# Patient Record
Sex: Male | Born: 1955
Health system: Southern US, Community
[De-identification: ages and names within clinical notes are randomized; demographics above are authoritative.]

## PROBLEM LIST (undated history)

## (undated) DIAGNOSIS — K649 Unspecified hemorrhoids: Secondary | ICD-10-CM

## (undated) DIAGNOSIS — R319 Hematuria, unspecified: Secondary | ICD-10-CM

## (undated) DIAGNOSIS — H539 Unspecified visual disturbance: Secondary | ICD-10-CM

## (undated) DIAGNOSIS — S0121XA Laceration without foreign body of nose, initial encounter: Secondary | ICD-10-CM

## (undated) DIAGNOSIS — N289 Disorder of kidney and ureter, unspecified: Secondary | ICD-10-CM

## (undated) DIAGNOSIS — R55 Syncope and collapse: Secondary | ICD-10-CM

## (undated) DIAGNOSIS — Z905 Acquired absence of kidney: Secondary | ICD-10-CM

## (undated) DIAGNOSIS — K648 Other hemorrhoids: Principal | ICD-10-CM

## (undated) DIAGNOSIS — T7840XA Allergy, unspecified, initial encounter: Secondary | ICD-10-CM

## (undated) DIAGNOSIS — S83249A Other tear of medial meniscus, current injury, unspecified knee, initial encounter: Secondary | ICD-10-CM

## (undated) DIAGNOSIS — D649 Anemia, unspecified: Secondary | ICD-10-CM

## (undated) DIAGNOSIS — R0981 Nasal congestion: Secondary | ICD-10-CM

## (undated) DIAGNOSIS — IMO0002 Reserved for concepts with insufficient information to code with codable children: Secondary | ICD-10-CM

## (undated) HISTORY — DX: Hematuria, unspecified: R31.9

## (undated) HISTORY — DX: Laceration without foreign body of nose, initial encounter: S01.21XA

## (undated) HISTORY — DX: Unspecified hemorrhoids: K64.9

## (undated) HISTORY — DX: Reserved for concepts with insufficient information to code with codable children: IMO0002

## (undated) HISTORY — DX: Unspecified visual disturbance: H53.9

## (undated) HISTORY — DX: Other hemorrhoids: K64.8

## (undated) HISTORY — PX: TONSILLECTOMY: SUR1361

## (undated) HISTORY — DX: Other tear of medial meniscus, current injury, unspecified knee, initial encounter: S83.249A

## (undated) HISTORY — DX: Syncope and collapse: R55

## (undated) HISTORY — DX: Allergy, unspecified, initial encounter: T78.40XA

## (undated) HISTORY — DX: Anemia, unspecified: D64.9

## (undated) HISTORY — DX: Acquired absence of kidney: Z90.5

## (undated) HISTORY — DX: Nasal congestion: R09.81

## (undated) HISTORY — PX: HEMORRHOID SURGERY: SHX153

---

## 1999-10-08 HISTORY — PX: KIDNEY DONATION: SHX685

## 2000-06-21 ENCOUNTER — Encounter: Admission: RE | Admit: 2000-06-21 | Discharge: 2000-06-21 | Payer: Self-pay | Admitting: Infectious Diseases

## 2000-07-22 ENCOUNTER — Encounter: Admission: RE | Admit: 2000-07-22 | Discharge: 2000-07-22 | Payer: Self-pay | Admitting: Internal Medicine

## 2000-08-25 ENCOUNTER — Encounter: Admission: RE | Admit: 2000-08-25 | Discharge: 2000-08-25 | Payer: Self-pay | Admitting: Internal Medicine

## 2000-09-01 ENCOUNTER — Encounter: Admission: RE | Admit: 2000-09-01 | Discharge: 2000-09-01 | Payer: Self-pay | Admitting: Infectious Diseases

## 2001-09-01 ENCOUNTER — Ambulatory Visit (HOSPITAL_COMMUNITY): Admission: RE | Admit: 2001-09-01 | Discharge: 2001-09-01 | Payer: Self-pay | Admitting: Gastroenterology

## 2005-12-22 ENCOUNTER — Ambulatory Visit: Payer: Self-pay | Admitting: Sports Medicine

## 2005-12-22 DIAGNOSIS — IMO0002 Reserved for concepts with insufficient information to code with codable children: Secondary | ICD-10-CM | POA: Insufficient documentation

## 2005-12-22 HISTORY — DX: Reserved for concepts with insufficient information to code with codable children: IMO0002

## 2006-02-02 ENCOUNTER — Ambulatory Visit: Payer: Self-pay | Admitting: *Deleted

## 2006-04-06 ENCOUNTER — Encounter: Payer: Self-pay | Admitting: Sports Medicine

## 2006-04-06 ENCOUNTER — Ambulatory Visit: Payer: Self-pay | Admitting: Sports Medicine

## 2009-07-03 ENCOUNTER — Emergency Department (HOSPITAL_COMMUNITY): Admission: EM | Admit: 2009-07-03 | Discharge: 2009-07-03 | Payer: Self-pay | Admitting: Emergency Medicine

## 2010-04-14 LAB — POCT I-STAT, CHEM 8
BUN: 11 mg/dL (ref 6–23)
Creatinine, Ser: 1 mg/dL (ref 0.4–1.5)
Glucose, Bld: 120 mg/dL — ABNORMAL HIGH (ref 70–99)
Potassium: 4.4 mEq/L (ref 3.5–5.1)
Sodium: 139 mEq/L (ref 135–145)

## 2010-04-14 LAB — CBC
Hemoglobin: 13.6 g/dL (ref 13.0–17.0)
MCV: 85.8 fL (ref 78.0–100.0)
RBC: 4.73 MIL/uL (ref 4.22–5.81)
RDW: 15.8 % — ABNORMAL HIGH (ref 11.5–15.5)

## 2010-04-14 LAB — DIFFERENTIAL
Eosinophils Relative: 1 % (ref 0–5)
Lymphocytes Relative: 15 % (ref 12–46)

## 2010-06-13 NOTE — Op Note (Signed)
   NAME:  Gregory Petersen, Gregory Petersen                            ACCOUNT NO.:  [id number]   MEDICAL RECORD NO.:  [id number]                   PATIENT TYPE:  AMB   LOCATION:  ENDO                                 FACILITY:  [facility]   PHYSICIAN:  James L. Malon Kindle., M.D.          DATE OF BIRTH:  30-Oct-1955   DATE OF PROCEDURE:  09/01/2001  DATE OF DISCHARGE:                                 OPERATIVE REPORT   PROCEDURE:  Colonoscopy.   MEDICATIONS:  Fentanyl 70 mcg, Versed 7 mg IV.   INDICATIONS:  Rectal bleeding.   DESCRIPTION OF PROCEDURE:  The procedure had been explained to the patient  and consent obtained.  With the patient in the left lateral decubitus  position, the Olympus adult video scope was inserted and advanced under  direct visualization.  The prep was excellent.  We were able to reach the  cecum without difficulty.  The scope was withdrawn and the cecum, ascending  colon, hepatic flexure, transverse colon, splenic flexure, descending and  sigmoid colon were seen well upon removal.  No polyps were seen in the  rectum.  The scope was retroflexed with a finding of large internal  hemorrhoids, otherwise unremarkable.  The scope was withdrawn.  The patient  tolerated the procedure well, maintained on low-flow oxygen and pulse  oximeter throughout the procedure.   ASSESSMENT:  Rectal bleeding probably due to hemorrhoids.   PLAN:  Give hemorrhoid sheet, fiber supplements, and we will see back in the  office on an as-needed basis.                                               James L. Malon Kindle., M.D.    Waldron Session  D:  09/01/2001  T:  09/05/2001  Job:  [id number]   cc:   Heather Roberts, M.D.

## 2011-06-17 ENCOUNTER — Ambulatory Visit (INDEPENDENT_AMBULATORY_CARE_PROVIDER_SITE_OTHER): Payer: BC Managed Care – PPO | Admitting: Surgery

## 2011-06-17 ENCOUNTER — Encounter (INDEPENDENT_AMBULATORY_CARE_PROVIDER_SITE_OTHER): Payer: Self-pay | Admitting: Surgery

## 2011-06-17 ENCOUNTER — Telehealth (INDEPENDENT_AMBULATORY_CARE_PROVIDER_SITE_OTHER): Payer: Self-pay

## 2011-06-17 VITALS — BP 138/62 | HR 76 | Temp 96.2°F | Resp 12 | Ht 70.0 in | Wt 163.0 lb

## 2011-06-17 DIAGNOSIS — K648 Other hemorrhoids: Secondary | ICD-10-CM | POA: Insufficient documentation

## 2011-06-17 HISTORY — DX: Other hemorrhoids: K64.8

## 2011-06-17 NOTE — Progress Notes (Signed)
Subjective:     Patient ID: Gregory Petersen, male   DOB: 04-23-1955, 56 y.o.   MRN: [id number]  HPI  JOAHAN Petersen  1955-02-18 [id number]  Patient Care Team: Tonye Pearson, MD as PCP - General (Family Medicine) Vertell Novak., MD as Consulting Physician (Gastroenterology)  This patient is a 56 y.o.male who presents today for surgical evaluation.   Reason for visit: "Pain in the ass" ?Hemorrhoid problem  Patient is a pleasant avid bicycler. He is struggled with intermittent rectal bleeding and presumed hemorrhoid problems for the past 2 decades usually then mild and manageable.  However, the last 4 months he's noted intermittent prolapsing. He's used some creams. That is helped decreased the pain did not resolve him. It seems to go down and then can re\re flare up. He's not had severe pain with defecation. It's more burning and itching afterwards. No incontinence to flatus or stool. Some mild blood. He has had a colonoscopy by Dr. Randa Evens about 10 years ago was told it was clean. Report of that is pending. He normally has a bowel movement 1-2 times a day. No fiber supplements. Often these can flareup after a cycling run. He's backed off on that recently. He comes today with his wife. She notes it's been an intermittent frustration for him. He has not had any prior anorectal interventions or treatments. No pus. No mucus. No major soiling.  Patient Active Problem List  Diagnoses  . Hemorrhoids, internal, with bleeding & occasional prolapse    Past Medical History  Diagnosis Date  . Hemorrhoids   . Nasal congestion   . Rectal pain   . Blood in stool   . Rectal bleeding   . MEDIAL MENISCUS TEAR, LEFT 12/22/2005    Qualifier: Diagnosis of  By: Mannie Stabile MD, JOSEPH    . Hemorrhoids, internal, with bleeding & occasional prolapse 06/17/2011    Past Surgical History  Procedure Date  . 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 Tonsillectomy     patient does not remember exact date - was in childhood     History   Social History  . Marital Status: Married    Spouse Name: N/A    Number of Children: N/A  . Years of Education: N/A   Occupational History  . Not on file.   Social History Main Topics  . Smoking status: Former Smoker    Quit date: 06/16/1989  . Smokeless tobacco: Never Used  . Alcohol Use: Yes     beer - occasional  . Drug Use: No  . Sexually Active: Not on file   Other Topics Concern  . Not on file   Social History Narrative  . No narrative on file    History reviewed. No pertinent family history.  Current Outpatient Prescriptions  Medication Sig Dispense Refill  . diphenhydrAMINE (BENADRYL) 25 mg capsule Take 25 mg by mouth as needed.         No Known Allergies  BP 138/62  Pulse 76  Temp(Src) 96.2 F (35.7 C) (Temporal)  Resp 12  Ht 5\' 10"  (1.778 m)  Wt 163 lb (73.936 kg)  BMI 23.39 kg/m2  No results found.   Review of Systems  Constitutional: Negative for fever, chills and diaphoresis.  HENT: Negative for nosebleeds, sore throat, facial swelling, mouth sores, trouble swallowing and ear discharge.   Eyes: Negative for photophobia, discharge and visual disturbance.  Respiratory: Negative for choking, chest tightness, shortness of breath and stridor.   Cardiovascular: Negative for  chest pain and palpitations.       Active cycler, ##miles/week  Gastrointestinal: Negative for nausea, vomiting, abdominal pain, diarrhea, constipation, blood in stool, abdominal distention, anal bleeding and rectal pain.       No personal nor family history of GI/colon cancer, inflammatory bowel disease, irritable bowel syndrome, allergy such as Celiac Sprue, dietary/dairy problems, colitis, ulcers nor gastritis.    No recent sick contacts/gastroenteritis.  No travel outside the country.  No changes in diet.    Genitourinary: Negative for dysuria, urgency, difficulty urinating and testicular pain.  Musculoskeletal: Negative for myalgias, back pain, arthralgias  and gait problem.  Skin: Negative for color change, pallor, rash and wound.  Neurological: Negative for dizziness, speech difficulty, weakness, numbness and headaches.  Hematological: Negative for adenopathy. Does not bruise/bleed easily.  Psychiatric/Behavioral: Negative for hallucinations, confusion and agitation.       Objective:   Physical Exam  Constitutional: He is oriented to person, place, and time. He appears well-developed and well-nourished. No distress.  HENT:  Head: Normocephalic.  Mouth/Throat: Oropharynx is clear and moist. No oropharyngeal exudate.  Eyes: Conjunctivae and EOM are normal. Pupils are equal, round, and reactive to light. No scleral icterus.  Neck: Normal range of motion. Neck supple. No tracheal deviation present.  Cardiovascular: Normal rate, regular rhythm and intact distal pulses.   Pulmonary/Chest: Effort normal and breath sounds normal. No respiratory distress.  Abdominal: Soft. He exhibits no distension. There is no tenderness. Hernia confirmed negative in the right inguinal area and confirmed negative in the left inguinal area.         Umb pierced  Genitourinary:       Exam done with assistance of male Medical Assistant in the room.  Perianal skin clean with good hygiene.  No pruritis.  Left lat/postlat small ext hemorrhoid only.  No pilonidal disease.  No fissure.  No abscess/fistula.    Tolerates digital and anoscopic rectal exam.  Normal sphincter tone.  No rectal masses.  Hemorrhoidal piles enlarged Lat>Rposterior>>R anterior   Musculoskeletal: Normal range of motion. He exhibits no tenderness.  Lymphadenopathy:    He has no cervical adenopathy.       Right: No inguinal adenopathy present.       Left: No inguinal adenopathy present.  Neurological: He is alert and oriented to person, place, and time. No cranial nerve deficit. He exhibits normal muscle tone. Coordination normal.  Skin: Skin is warm and dry. No rash noted. He is not  diaphoretic. No erythema. No pallor.  Psychiatric: He has a normal mood and affect. His behavior is normal. Judgment and thought content normal.       Assessment:     Hemorrhoids with bleeding/prolapse    Plan:     3 piles injected:  The anatomy & physiology of the anorectal region was discussed.  The pathophysiology of hemorrhoids and differential diagnosis was discussed.  Natural history progression  was discussed.   I stressed the importance of a bowel regimen to have daily soft bowel movements to minimize progression of disease.     The patient's symptoms are not adequately controlled.  I am skeptical that a bowel regimen alone is going to be sufficient in this case. Therefore, I recommended sclerotherapy injection to treat the hemorrhoids.  They are too sessile to respond to banding very well.  I went over the technique, risks, benefits, and alternatives.   Goals of post-operative recovery were discussed as well.  Questions were answered.  The patient expressed understanding &  wished to proceed.  The patient was positioned in the lateral decubitus position.  Perianal & rectal examination was done.  Using anoscopy, I injected the hemorrhoids above the dentate line x3 piles.  The patient tolerated the procedure well.  Educational handouts further explaining the pathology, treatment options, and bowel regimen were given as well.   I noted that he may have more swelling and prolapse and bleeding initially. This should gradually improve over the next week. Persistent bleeding is not normal. It may be time to consider repeat colonoscopy. They will keep me posted. If he requires repeated injections / bandings and is still having problems, then I would consider a THD hemorrhoidal ligation/pexy. However, I do not think the disease is severe enough to warrant that at this time. He and his wife agree

## 2011-06-17 NOTE — Patient Instructions (Signed)
HEMORRHOIDS   The rectum is the last few inches of your colon, and it naturally stretches to hold stool.  Hemorrhoidal piles are natural clusters of blood vessels that help the rectum stretch to hold stool and allow bowel movements to eliminate feces.  Hemorrhoids are abnormally swollen blood vessels in the rectum.  Too much pressure in the rectum causes hemorrhoids by forcing blood to stretch and bulge the walls of the veins, sometimes even rupturing them.  Hemorrhoids can become like varicose veins you might see on a person's legs. When bulging hemorrhoidal veins are irritated, they can swell, burn, itch, become very painful, and bleed. Once the rectal veins have been stretched out and hemorrhoids created, they are difficult to get rid of completely and tend to recur with less straining than it took to cause them in the first place. Fortunately, good habits and simple medical treatment usually control hemorrhoids well, and surgery is only recommended in unusually severe cases. Some of the most frequent causes of hemorrhoids:    Constant sitting    Straining with bowel movements (from constipation or hard stools)    Diarrhea    Sitting on the toilet for a long time    Severe coughing    Childbirth    Heavy Lifting  Types of Hemorrhoids:    Internal hemorrhoids usually don't [place] or itch; they are deep inside the rectum and usually have no sensation. However, internal hemorrhoids can bleed.  Such bleeding should not be ignored and mask blood from a dangerous source like colorectal cancer, so persistent rectal bleeding should be investigated with a colonoscopy.    External hemorrhoids cause most of the symptoms - pain, burning, and itching. Unirritated hemorrhoids can look like small skin tags coming out of the anus.     Thrombosed hemorrhoids can form when a hemorrhoid blood vessel bursts and causes the hemorrhoid to swell.  A purple blood clot can form in it and become an excruciatingly painful lump  at the anus. Because of these unpleasant symptoms, immediate incision and drainage by a surgeon at an office visit can provide much relief of the pain.    PREVENTION Avoiding the causes listed in above will prevent most cases of hemorrhoids, but this advice is sometimes hard to follow:  How can you avoid sitting all day if you have a seated job? Also, we try to avoid coughing and diarrhea, but sometimes it's beyond your control.  Still, there are some practical hints to help:    If your main job activity is seated, always stand or walk during your breaks. Make it a point to stand and walk at least 5 minutes every hour and try to shift frequently in your chair to avoid direct rectal pressure.    Always exhale as you strain or lift. Don't hold your breath.    Treat coughing, diarrhea and constipation early since irritated hemorrhoids may soon follow.    Do not delay or try to prevent a bowel movement when the urge is present.   Exercise regularly (walking or jogging 60 minutes a day) to stimulate the bowels to move.   Avoid dry toilet paper when cleaning after bowel movements.  Moistened tissues such as baby wipes are less irritating.  Lightly pat the rectal area dry.  Using irrigating showers or bottle irrigation washing can more gently clean this sensitive area.   Keep the anal and genital area clean and  dry.  Talcum or baby powders can help   GET   YOUR STOOLS SOFT.   This is the most important way to prevent irritated hemorrhoids.  Hard stools are like sandpaper to the anorectal canal and will cause more problems.   The goal: ONE SOFT BOWEL MOVEMENT A DAY!  To have soft, regular bowel movements:    Drink at least 8 tall glasses of water a day.     AVOID CONSTIPATION    Take plenty of fiber.  Fiber is the undigested part of plant food that passes into the colon, acting s "natures broom" to encourage bowel motility and movement.  Fiber can absorb and hold large amounts of water. This results in a  larger, bulkier stool, which is soft and easier to pass. Work gradually over several weeks up to 6 servings a day of fiber (25g a day even more if needed) in the form of: o Vegetables -- Root (potatoes, carrots, turnips), leafy green (lettuce, salad greens, celery, spinach), or cooked high residue (cabbage, broccoli, etc) o Fruit -- Fresh (unpeeled skin & pulp), Dried (prunes, apricots, cherries, etc ),  or stewed ( applesauce)  o Whole grain breads, pasta, etc (whole wheat)  o Bran cereals    Bulking Agents -- This type of water-retaining fiber generally is easily obtained each day by one of the following:  o Psyllium bran -- The psyllium plant is remarkable because its ground seeds can retain so much water. This product is available as Metamucil, Konsyl, Effersyllium, Per Diem Fiber, or the less expensive generic preparation in drug and health food stores. Although labeled a laxative, it really is not a laxative.  o Methylcellulose -- This is another fiber derived from wood which also retains water. It is available as Citrucel. o Polyethylene Glycol - and "artificial" fiber commonly called Miralax or Glycolax.  It is helpful for people with gassy or bloated feelings with regular fiber o Flax Seed - a less gassy fiber than psyllium   No reading or other relaxing activity while on the toilet. If bowel movements take longer than 5 minutes, you are too constipated   Laxatives can be useful for a short period if constipation is severe o Osmotics (Milk of Magnesia, Fleets phosphosoda, Magnesium citrate, MiraLax, GoLytely) are safer than  o Stimulants (Senokot, Castor Oil, Dulcolax, Ex Lax)    o Do not take laxatives for more than 7days in a row.   Laxatives are not a good long-term solution as it can stress the intestine and colon and causes too much mineral and fluid losses.    If badly constipated, try a Bowel Retraining Program: o Do not use laxatives.  o Eat a diet high in roughage, such as bran  cereals and leafy vegetables.  o Drink six (6) ounces of prune or apricot juice each morning.  o Eat two (2) large servings of stewed fruit each day.  o Take one (1) heaping dose of a bulking agent (ex. Metamucil, Citrucel, Miralax) twice a day.  o Use sugar-free sweetener when possible to avoid excessive calories.  o Eat a normal breakfast.  o Set aside 15 minutes after breakfast to sit on the toilet, but do not strain to have a bowel movement.  o If you do not have a bowel movement by the third day, use an enema and repeat the above steps.    AVOID DIARRHEA o Switch to liquids and simpler foods for a few days to avoid stressing your intestines further. o Avoid dairy products (especially milk & ice cream) for a short   time.  The intestines often can lose the ability to digest lactose when stressed. o Avoid foods that cause gassiness or bloating.  Typical foods include beans and other legumes, cabbage, broccoli, and dairy foods.  Every person has some sensitivity to other foods, so listen to our body and avoid those foods that trigger problems for you. o Adding fiber (Citrucel, Metamucil, psyllium, Miralax) gradually can help thicken stools by absorbing excess fluid and retrain the intestines to act more normally.  Slowly increase the dose over a few weeks.  Too much fiber too soon can backfire and cause cramping & bloating. o Probiotics (such as active yogurt, Align, etc) may help repopulate the intestines and colon with normal bacteria and calm down a sensitive digestive tract.  Most studies show it to be of mild help, though, and such products can be costly. o Medicines:   Bismuth subsalicylate (ex. Kayopectate, Pepto Bismol) every 30 minutes for up to 6 doses can help control diarrhea.  Avoid if pregnant.   Loperamide (Immodium) can slow down diarrhea.  Start with two tablets (4mg total) first and then try one tablet every 6 hours.  Avoid if you are having fevers or severe pain.  If you are not  better or start feeling worse, stop all medicines and call your doctor for advice o Call your doctor if you are getting worse or not better.  Sometimes further testing (cultures, endoscopy, X-ray studies, bloodwork, etc) may be needed to help diagnose and treat the cause of the diarrhea.   If these preventive measures fail, you must take action right away! Hemorrhoids are one condition that can be mild in the morning and become intolerable by nightfall.      

## 2011-06-17 NOTE — Telephone Encounter (Signed)
Pt calling in after being seen today in clinic by Dr Michaell Cowing and received hemorrhoid injections. The pt is c/o having blood in his urine. The pt is not having any other symptoms except the bleeding. I called Dr Michaell Cowing to notify him and he advised pt to drink plenty of fluids to try to flush his symstem out. This might be prostatitis or passing a kidney stone. I advised the pt that if this continue's the next 24 hrs the pt needs to call us b/c we will need to refer pt to urology. I did advise if this gets worse tonight for the pt to go to [facility] ER. The pt understands.

## 2011-06-18 ENCOUNTER — Telehealth (INDEPENDENT_AMBULATORY_CARE_PROVIDER_SITE_OTHER): Payer: Self-pay

## 2011-06-18 NOTE — Telephone Encounter (Signed)
This is highly unusual after injection. The injections were superficial and submucosal only. Most injections were posterior and away from the prostate/urethra. We encouraged pushing oral fluids and following. Hopefully it should trend back down. 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 That would be more expected than in this situation. We'll try and keep close tabs on him

## 2011-06-18 NOTE — Telephone Encounter (Signed)
LMOM for pt to check on him from his appt with Dr Michaell Cowing yesterday since he was having some bleeding with urination.

## 2011-06-19 ENCOUNTER — Ambulatory Visit (INDEPENDENT_AMBULATORY_CARE_PROVIDER_SITE_OTHER): Payer: BC Managed Care – PPO | Admitting: General Surgery

## 2011-06-19 ENCOUNTER — Telehealth: Payer: Self-pay

## 2011-06-19 ENCOUNTER — Encounter (INDEPENDENT_AMBULATORY_CARE_PROVIDER_SITE_OTHER): Payer: Self-pay | Admitting: General Surgery

## 2011-06-19 ENCOUNTER — Other Ambulatory Visit (INDEPENDENT_AMBULATORY_CARE_PROVIDER_SITE_OTHER): Payer: Self-pay | Admitting: General Surgery

## 2011-06-19 ENCOUNTER — Telehealth (INDEPENDENT_AMBULATORY_CARE_PROVIDER_SITE_OTHER): Payer: Self-pay | Admitting: General Surgery

## 2011-06-19 DIAGNOSIS — R319 Hematuria, unspecified: Secondary | ICD-10-CM | POA: Insufficient documentation

## 2011-06-19 DIAGNOSIS — K648 Other hemorrhoids: Secondary | ICD-10-CM

## 2011-06-19 MED ORDER — CIPROFLOXACIN HCL 500 MG PO TABS: 500.0000 mg | ORAL_TABLET | Freq: Two times a day (BID) | ORAL | Status: AC

## 2011-06-19 NOTE — Progress Notes (Signed)
Subjective:     Patient ID: Gregory Petersen, male   DOB: [birth year], 56 y.o.   MRN: [id number]  HPI Patient underwent injection of internal hemorrhoids by Dr.Gross on Jun 17, 2011. Shortly afterwards, he passed some bloody urine. He saw Dr. gross when he recommended hydration. His urine cleared but he had a bowel movement yesterday and the blood in his urine return. His hemorrhoid symptoms, fortunately, have improved greatly.  Review of Systems     Objective:   Physical Exam External anal exam reveals small skin intact with no perianal tenderness or erythema, no significant prostate tenderness    Assessment:     Hematuria status post injection of internal hemorrhoids    Plan:     I suspect his prostate has local inflammation due to the injections. I will start him on ciprofloxacin. 7 urinalysis with micro-. We will have him evaluated at [facility] early next week.

## 2011-06-19 NOTE — Telephone Encounter (Signed)
Pt calling back (as instructed) to report he is no better, possibly worse.  Dr. Michaell Cowing aware that surgery has possibly triggered prostate flare-up.  Pt reports he now has "constant feeling of need to urinate", with lots of bright red blood and clots."  Recommended he see his PCP today.  I called Dr. Netta Corrigan office to report this problem and they are expecting him; called pt at work to "go now" to PCP for immediate evaluation and treatment.  [facility].)  He understands and will comply.

## 2011-06-19 NOTE — Telephone Encounter (Signed)
Advised pt he will need to be seen for this. HE WILL COME IN THIS AFTERNOON.

## 2011-06-19 NOTE — Telephone Encounter (Signed)
Pt went to a general surgeon for hemorrids and now having problems with blood in urine and would like to talk with someone   Please call [phone]

## 2011-06-23 ENCOUNTER — Telehealth (INDEPENDENT_AMBULATORY_CARE_PROVIDER_SITE_OTHER): Payer: Self-pay

## 2011-06-23 NOTE — Telephone Encounter (Signed)
LMOM for pt to call me so I can check on him after his problems last week.

## 2011-06-23 NOTE — Telephone Encounter (Signed)
Pt returned my call. The pt did go and see Dr Benn Moulder today for blood in urine. The pt did have some bleeding with urination over the weekend but it has stopped for now. The pt said that Dr Benn Moulder told him that he did not feel the hemorrhoid injection had anything to do with the bleeding of urination. The pt is going to be followed closely by Dr Benn Moulder to see if they can figure out where this came from. The pt is doing great from the injections for his hemorrhoids.

## 2011-06-24 LAB — URINALYSIS, ROUTINE W REFLEX MICROSCOPIC
Ketones, ur: 15 mg/dL — AB
Nitrite: POSITIVE — AB
Specific Gravity, Urine: 1.013 (ref 1.005–1.030)
Urobilinogen, UA: 2 mg/dL — ABNORMAL HIGH (ref 0.0–1.0)
pH: 6 (ref 5.0–8.0)

## 2011-06-24 LAB — URINALYSIS, MICROSCOPIC ONLY
Crystals: NONE SEEN
Squamous Epithelial / LPF: NONE SEEN

## 2011-08-24 ENCOUNTER — Ambulatory Visit (INDEPENDENT_AMBULATORY_CARE_PROVIDER_SITE_OTHER): Payer: BC Managed Care – PPO | Admitting: Internal Medicine

## 2011-08-24 ENCOUNTER — Ambulatory Visit (HOSPITAL_COMMUNITY)
Admission: RE | Admit: 2011-08-24 | Discharge: 2011-08-24 | Disposition: A | Discharge status: Home / Self Care | Payer: BC Managed Care – PPO | Source: Ambulatory Visit | Attending: Physician Assistant | Admitting: Physician Assistant

## 2011-08-24 VITALS — BP 124/76 | HR 61 | Temp 97.8°F | Resp 17 | Ht 70.0 in | Wt 160.0 lb

## 2011-08-24 DIAGNOSIS — M79669 Pain in unspecified lower leg: Secondary | ICD-10-CM

## 2011-08-24 DIAGNOSIS — M79662 Pain in left lower leg: Secondary | ICD-10-CM

## 2011-08-24 DIAGNOSIS — M7989 Other specified soft tissue disorders: Secondary | ICD-10-CM

## 2011-08-24 DIAGNOSIS — M79609 Pain in unspecified limb: Secondary | ICD-10-CM | POA: Insufficient documentation

## 2011-08-24 NOTE — Patient Instructions (Addendum)
Doppler study today of Right leg be at [facility] admitting, study will be done at 3:30

## 2011-08-24 NOTE — Progress Notes (Signed)
  Subjective:    Patient ID: Gregory Petersen, male    DOB: 11-Jan-1956, 56 y.o.   MRN: [id number]  HPI Pt presents to clinic with R leg pain for the last week.  It started on Wednesday behind his knee and was swollen and painful.  Then the pain started to go down into his calf as well as the swelling.  He went away this weekend and the pain increased and he started to use a cane.  Overall the pain is significantly improved but he is worried because he is going to [place] next week on a plane.  He has no SOB and no risk factors for DVT.  He has had no injury to his leg.  Review of Systems  Constitutional: Negative for fever and chills.  Respiratory: Negative for cough and shortness of breath.   Musculoskeletal: Negative for joint swelling.       R leg pain.        Objective:   Physical Exam  Constitutional: He appears well-developed and well-nourished.  HENT:  Head: Normocephalic and atraumatic.  Right Ear: External ear normal.  Left Ear: External ear normal.  Nose: Nose normal.  Eyes: Conjunctivae are normal.  Neck: Normal range of motion.  Pulmonary/Chest: Effort normal.  Musculoskeletal:       Legs:  TTP in popliteal space and inferior, + homan's sign.  Palpable cord in R calf with TTP. R calf 39.5cm, L calf 37.5cm     Assessment & Plan:   1. Calf pain    2. Pain of left calf  Lower Extremity Venous Duplex Right  3. Swelling of calf     (#2 should be R calf pain) Pt to get venous doppler - wait there until he hears from [place] about report.  Call report - neg DVT but ruptured baker's cyst.  Talked with pt - to use NSAIDs for pain and make sure on flight he moves around because of swelling we do not want him to have increased risk of DVT on long plane ride next week.  Pt understands and agrees with the above.  Discussed pt with Dr. Merla Riches.

## 2011-08-24 NOTE — Progress Notes (Signed)
*  PRELIMINARY RESULTS* Vascular Ultrasound Right lower extremity venous duplex has been completed.  Preliminary findings: Right= No evidence of DVT. A large Baker's cyst is noted extending into the proximal calf.  Farrel Demark RDMS 08/24/2011, 3:45 PM

## 2011-11-11 ENCOUNTER — Encounter: Payer: Self-pay | Admitting: *Deleted

## 2012-05-02 ENCOUNTER — Ambulatory Visit: Payer: BC Managed Care – PPO

## 2012-05-02 ENCOUNTER — Ambulatory Visit (INDEPENDENT_AMBULATORY_CARE_PROVIDER_SITE_OTHER): Payer: BC Managed Care – PPO | Admitting: Emergency Medicine

## 2012-05-02 VITALS — BP 114/72 | HR 92 | Temp 98.4°F | Resp 17 | Ht 70.5 in | Wt 161.0 lb

## 2012-05-02 DIAGNOSIS — R062 Wheezing: Secondary | ICD-10-CM

## 2012-05-02 DIAGNOSIS — R059 Cough, unspecified: Secondary | ICD-10-CM

## 2012-05-02 DIAGNOSIS — R05 Cough: Secondary | ICD-10-CM

## 2012-05-02 LAB — POCT CBC
Granulocyte percent: 70.2 %G (ref 37–80)
HCT, POC: 40 % — AB (ref 43.5–53.7)
Hemoglobin: 13.1 g/dL — AB (ref 14.1–18.1)
MCH, POC: 27.5 pg (ref 27–31.2)
MCHC: 32.8 g/dL (ref 31.8–35.4)
MCV: 83.8 fL (ref 80–97)
MID (cbc): 0.6 (ref 0–0.9)
POC Granulocyte: 4.1 (ref 2–6.9)
POC LYMPH PERCENT: 20.4 %L (ref 10–50)
POC MID %: 9.4 %M (ref 0–12)
Platelet Count, POC: 203 10*3/uL (ref 142–424)
RDW, POC: 16.1 %
WBC: 5.9 10*3/uL (ref 4.6–10.2)

## 2012-05-02 MED ORDER — ALBUTEROL SULFATE (2.5 MG/3ML) 0.083% IN NEBU: 2.5000 mg | INHALATION_SOLUTION | Freq: Once | RESPIRATORY_TRACT | Status: DC

## 2012-05-02 MED ORDER — PREDNISONE 10 MG PO TABS: ORAL_TABLET | ORAL | Status: DC

## 2012-05-02 MED ORDER — ALBUTEROL SULFATE HFA 108 (90 BASE) MCG/ACT IN AERS: 2.0000 | INHALATION_SPRAY | RESPIRATORY_TRACT | Status: DC | PRN

## 2012-05-02 NOTE — Progress Notes (Deleted)
  Subjective:    Patient ID: Gregory Petersen, male    DOB: 24-Feb-1955, 57 y.o.   MRN: [id number]  HPI    Review of Systems     Objective:   Physical Exam        Assessment & Plan:

## 2012-05-02 NOTE — Progress Notes (Signed)
  Subjective:    Gregory Petersen, male    DOB: 09/07/1955, 57 y.o.   MRN: [id number]  HPI Gregory states he has hay fever with post nasal drip, cough. He also experiences wheezing with going up and down stairs. Cough is worse at night and he isn't able to sleep well. Has tried Mucinex and Benadryl with little relief. Gregory has the same symptoms this time of the year every year.   Review of Systems     Objective:   Physical Exam TMs are normal. Nose is normal . Neck is supple. Chest is symmetrical breath sounds prolonged expiration wheezes are heard.  [facility] reading (PRIMARY) by  Dr Cleta Alberts no acute disease  Results for orders placed in visit on 05/02/12  POCT CBC      Result Value Range   WBC 5.9  4.6 - 10.2 K/uL   Lymph, poc 1.2  0.6 - 3.4   POC LYMPH PERCENT 20.4  10 - 50 %L   MID (cbc) 0.6  0 - 0.9   POC MID % 9.4  0 - 12 %M   POC Granulocyte 4.1  2 - 6.9   Granulocyte percent 70.2  37 - 80 %G   RBC 4.77  4.69 - 6.13 M/uL   Hemoglobin 13.1 (*) 14.1 - 18.1 g/dL   HCT, POC 40.9 (*) 81.1 - 53.7 %   MCV 83.8  80 - 97 fL   MCH, POC 27.5  27 - 31.2 pg   MCHC 32.8  31.8 - 35.4 g/dL   RDW, POC 91.4     Platelet Count, POC 203  142 - 424 K/uL   MPV 8.7  0 - 99.8 fL   Peak flow 470     Assessment & Plan:       I suspect this is all reactive airways disease. We'll treat with prednisone taper along with an albuterol HFA inhaler and he will continue either Zyrtec or Claritin on daily basis. Hemoglobin was slightly low at 13.1 previous hemoglobin was 13.6 was about repeat in 3 months. He is up-to-date on his colonoscopy

## 2012-05-02 NOTE — Patient Instructions (Addendum)
Allergies, Generic  Allergies may happen from anything your body is sensitive to. This may be food, medicines, pollens, chemicals, and nearly anything around you in everyday life that produces allergens. An allergen is anything that causes an allergy producing substance. Heredity is often a factor in causing these problems. This means you may have some of the same allergies as your parents.  Food allergies happen in all age groups. Food allergies are some of the most severe and life threatening. Some common food allergies are cow's milk, seafood, eggs, nuts, wheat, and soybeans.  SYMPTOMS    Swelling around the mouth.   An itchy red rash or hives.   Vomiting or diarrhea.   Difficulty breathing.  SEVERE ALLERGIC REACTIONS ARE LIFE-THREATENING.  This reaction is called anaphylaxis. It can cause the mouth and throat to swell and cause difficulty with breathing and swallowing. In severe reactions only a trace amount of food (for example, peanut oil in a salad) may cause death within seconds.  Seasonal allergies occur in all age groups. These are seasonal because they usually occur during the same season every year. They may be a reaction to molds, grass pollens, or tree pollens. Other causes of problems are house dust mite allergens, pet dander, and mold spores. The symptoms often consist of nasal congestion, a runny itchy nose associated with sneezing, and tearing itchy eyes. There is often an associated itching of the mouth and ears. The problems happen when you come in contact with pollens and other allergens. Allergens are the particles in the air that the body reacts to with an allergic reaction. This causes you to release allergic antibodies. Through a chain of events, these eventually cause you to release histamine into the blood stream. Although it is meant to be protective to the body, it is this release that causes your discomfort. This is why you were given anti-histamines to feel better. If you are  unable to pinpoint the offending allergen, it may be determined by skin or blood testing. Allergies cannot be cured but can be controlled with medicine.  Hay fever is a collection of all or some of the seasonal allergy problems. It may often be treated with simple over-the-counter medicine such as diphenhydramine. Take medicine as directed. Do not drink alcohol or drive while taking this medicine. Check with your caregiver or package insert for child dosages.  If these medicines are not effective, there are many new medicines your caregiver can prescribe. Stronger medicine such as nasal spray, eye drops, and corticosteroids may be used if the first things you try do not work well. Other treatments such as immunotherapy or desensitizing injections can be used if all else fails. Follow up with your caregiver if problems continue. These seasonal allergies are usually not life threatening. They are generally more of a nuisance that can often be handled using medicine.  HOME CARE INSTRUCTIONS    If unsure what causes a reaction, keep a diary of foods eaten and symptoms that follow. Avoid foods that cause reactions.   If hives or rash are present:   Take medicine as directed.   You may use an over-the-counter antihistamine (diphenhydramine) for hives and itching as needed.   Apply cold compresses (cloths) to the skin or take baths in cool water. Avoid hot baths or showers. Heat will make a rash and itching worse.   If you are severely allergic:   Following a treatment for a severe reaction, hospitalization is often required for closer follow-up.     Wear a medic-alert bracelet or necklace stating the allergy.   You and your family must learn how to give adrenaline or use an anaphylaxis kit.   If you have had a severe reaction, always carry your anaphylaxis kit or EpiPen with you. Use this medicine as directed by your caregiver if a severe reaction is occurring. Failure to do so could have a fatal outcome.  SEEK  MEDICAL CARE IF:   You suspect a food allergy. Symptoms generally happen within 30 minutes of eating a food.   Your symptoms have not gone away within 2 days or are getting worse.   You develop new symptoms.   You want to retest yourself or your child with a food or drink you think causes an allergic reaction. Never do this if an anaphylactic reaction to that food or drink has happened before. Only do this under the care of a caregiver.  SEEK IMMEDIATE MEDICAL CARE IF:    You have difficulty breathing, are wheezing, or have a tight feeling in your chest or throat.   You have a swollen mouth, or you have hives, swelling, or itching all over your body.   You have had a severe reaction that has responded to your anaphylaxis kit or an EpiPen. These reactions may return when the medicine has worn off. These reactions should be considered life threatening.  MAKE SURE YOU:    Understand these instructions.   Will watch your condition.   Will get help right away if you are not doing well or get worse.  Document Released: 04/07/2002 Document Revised: 04/06/2011 Document Reviewed: 09/12/2007  ExitCare Patient Information 2013 [employer].

## 2013-04-25 ENCOUNTER — Ambulatory Visit (INDEPENDENT_AMBULATORY_CARE_PROVIDER_SITE_OTHER): Payer: BC Managed Care – PPO | Admitting: Internal Medicine

## 2013-04-25 VITALS — BP 120/80 | HR 92 | Temp 97.8°F | Resp 16 | Ht 70.0 in | Wt 162.4 lb

## 2013-04-25 DIAGNOSIS — H7292 Unspecified perforation of tympanic membrane, left ear: Secondary | ICD-10-CM

## 2013-04-25 DIAGNOSIS — H6690 Otitis media, unspecified, unspecified ear: Secondary | ICD-10-CM

## 2013-04-25 DIAGNOSIS — H9192 Unspecified hearing loss, left ear: Secondary | ICD-10-CM

## 2013-04-25 DIAGNOSIS — H7290 Unspecified perforation of tympanic membrane, unspecified ear: Secondary | ICD-10-CM

## 2013-04-25 DIAGNOSIS — H9190 Unspecified hearing loss, unspecified ear: Secondary | ICD-10-CM

## 2013-04-25 MED ORDER — AMOXICILLIN 500 MG PO CAPS: 1000.0000 mg | ORAL_CAPSULE | Freq: Two times a day (BID) | ORAL | Status: AC

## 2013-04-25 MED ORDER — PREDNISONE 20 MG PO TABS: ORAL_TABLET | ORAL | Status: DC
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
## 2013-09-24 ENCOUNTER — Ambulatory Visit (INDEPENDENT_AMBULATORY_CARE_PROVIDER_SITE_OTHER): Payer: BC Managed Care – PPO | Admitting: Family Medicine

## 2013-09-24 VITALS — BP 118/76 | HR 68 | Temp 97.9°F | Resp 18 | Ht 70.0 in | Wt 168.6 lb

## 2013-09-24 DIAGNOSIS — R21 Rash and other nonspecific skin eruption: Secondary | ICD-10-CM

## 2013-09-24 LAB — POCT CBC
Granulocyte percent: 55.4 %G (ref 37–80)
HCT, POC: 40.9 % — AB (ref 43.5–53.7)
Hemoglobin: 12.6 g/dL — AB (ref 14.1–18.1)
Lymph, poc: 2.1 (ref 0.6–3.4)
MCH, POC: 25.6 pg — AB (ref 27–31.2)
MCHC: 30.8 g/dL — AB (ref 31.8–35.4)
MCV: 83.1 fL (ref 80–97)
MID (cbc): 0.5 (ref 0–0.9)
MPV: 7.5 fL (ref 0–99.8)
POC Granulocyte: 3.2 (ref 2–6.9)
POC LYMPH PERCENT: 36.7 %L (ref 10–50)
POC MID %: 7.9 %M (ref 0–12)
Platelet Count, POC: 212 10*3/uL (ref 142–424)
RBC: 4.92 M/uL (ref 4.69–6.13)
RDW, POC: 16.6 %
WBC: 5.8 10*3/uL (ref 4.6–10.2)

## 2013-09-24 LAB — POCT SEDIMENTATION RATE: POCT SED RATE: 7 mm/hr (ref 0–22)

## 2013-09-24 MED ORDER — DOXYCYCLINE HYCLATE 100 MG PO TABS: 100.0000 mg | ORAL_TABLET | Freq: Two times a day (BID) | ORAL | Status: DC

## 2013-09-24 NOTE — Patient Instructions (Signed)
[place] Spotted Fever °[place] Spotted Fever (RMSF) is the oldest known tick-borne disease of people in the [place]. This disease was named because it was first described among people in the [place] area who had an illness characterized by a rash with red-purple-black spots. This disease is caused by a rickettsia (Rickettsia rickettsii), a bacteria carried by the tick. °The [place] wood tick and the [place] dog tick acquire and transmit the RMSF bacteria (pictures NOT actual size). When a larval, nymphal, or adult tick feeds on an infected rodent or larger animal, the tick can become infected. Infected adult ticks then feed on people who may then get RMSF. The tick transmits the disease to humans during a prolonged period of feeding that lasts many hours, days, or even a couple weeks. The bite is painless and frequently goes unnoticed. An infected male tick may also pass the rickettsial bacteria to her eggs that then may mature to be infected adult ticks. °The rickettsia that causes RMSF can also get into a person's body through damaged skin. A tick bite is not necessary. People can get RMSF if they crush a tick and get its blood or body fluids on their skin through a small cut or sore.  °DIAGNOSIS °Diagnosis is made by laboratory tests.  °TREATMENT °Treatment is with antibiotics (medications that kill rickettsia and other bacteria). Immediate treatment usually prevents death. °GEOGRAPHIC RANGE °This disease was reported only in the [place] until 1931. RMSF has more recently been described among individuals in all states except [place], [place], and [place]. The highest reported incidences of RMSF now occur among residents of [place], [place], [place], and the [place]. °TIME OF YEAR  °Most cases are diagnosed during late spring and summer when ticks are most active. However, especially in the warmer southern states, a few cases occur during the winter. °SYMPTOMS   °· Symptoms of RMSF begin from 2 to 14 days after a tick bite. The most common early symptoms are fever, muscle aches, and headache followed by nausea (feeling sick to your stomach) or vomiting. °· The RMSF rash is typically delayed until 3 or more days after symptom onset, and eventually develops in 9 of 10 infected patients by the fifth day of illness. °If the disease is not treated it can cause death. If you get a fever, headache, muscle aches, rash, nausea, or vomiting within 2 weeks of a possible tick bite or exposure, you should see your caregiver immediately. °PREVENTION °Ticks prefer to hide in shady, moist ground litter. They can often be found above the ground clinging to tall grass, brush, shrubs and low tree branches. They also inhabit lawns and gardens, especially at the edges of woodlands and around old stone walls. Within the areas where ticks generally live, no naturally vegetated area can be considered completely free of infected ticks. The best precaution against RMSF is to avoid contact with soil, leaf litter, and vegetation as much as possible in tick-infested areas. For those who enjoy gardening or walking in their yards, clear brush and mow tall grass around houses and at the edges of gardens. This may help reduce the tick population in the immediate area. Applications of chemical insecticides by a licensed professional in the spring (late May) and fall (September) will also control ticks, especially in heavily infested areas. Treatment will never get rid of all the ticks. Getting rid of small animal populations that host ticks will also decrease the tick population. When working in the garden, pruning   shrubs, or handling soil and vegetation, wear light-colored protective clothing and gloves. Spot-check often to prevent ticks from reaching the skin. Ticks cannot jump or fly. They will not drop from an above-ground perch onto a passing animal. Once a tick gains access to human skin it climbs  upward until it reaches a more protected area. For example, the back of the knee, groin, navel, armpit, ears, or nape of the neck. It then begins the slow process of embedding itself in the skin. °Campers, hikers, field workers, and others who spend time in wooded, brushy, or tall grassy areas can avoid exposure to ticks by using the following precautions: °· Wear light-colored clothing with a tight weave to spot ticks more easily and prevent contact with the skin. °· Wear long pants tucked into socks, long-sleeved shirts tucked into pants and enclosed shoes or boots along with insect repellent. °· Spray clothes with insect repellent containing either DEET or Permethrin. Only DEET can be used on exposed skin. Follow the manufacturer's directions carefully. °· Wear a hat and keep long hair pulled back. °· Stay on cleared, well-worn trails whenever possible. °· Spot-check yourself and others often for the presence of ticks on clothes. If you find one, there are likely to be others. Check thoroughly. °· Remove clothes after leaving tick-infested areas. If possible, wash them to eliminate any unseen ticks. Check yourself, your children and any pets from head to toe for the presence of ticks. °· Shower and shampoo. °You can greatly reduce your chances of contracting RMSF if you remove attached ticks as soon as possible. Regular checks of the body, including all body sites covered by hair (head, armpits, genitals), allow removal of the tick before rickettsial transmission. To remove an attached tick, use a forceps or tweezers to detach the intact tick without leaving mouth parts in the skin. The tick bite wound should be cleansed after tick removal. °Remember the most common symptoms of RMSF are fever, muscle aches, headache, and nausea or vomiting with a later onset of rash. If you get these symptoms after a tick bite and while living in an area where RMSF is found, RMSF should be suspected. If the disease is not  treated, it can cause death. See your caregiver immediately if you get these symptoms. Do this even if not aware of a tick bite. °Document Released: 04/26/2000 Document Revised: 05/29/2013 Document Reviewed: 12/17/2008 °ExitCare® Patient Information ©2015 [employer]. This information is not intended to replace advice given to you by your health care provider. Make sure you discuss any questions you have with your health care provider. ° °

## 2013-09-24 NOTE — Progress Notes (Addendum)
Subjective:  This chart was scribed for Gregory Sidle, MD  by Arlan Organ, [facility] Scribe. This patient was seen in room 8 and the patient's care was started 12:37 PM.    Patient ID: Gregory Petersen, male    DOB: [birth year], 58 y.o.   MRN: [id number]  Chief Complaint  Patient presents with   shingles    on back, noticed while camping    HPI HPI Comments: Gregory Petersen is a 58 y.o. male who presents to [facility] complaining of possible shingles. Pt states he noted rash to the L lower abdomen that has now resolved. However, he has noted similar rash to his back onset 2-3 days. Pt has tried his wife's Acyclovir with improvement for abdominal rash. At this time he denies any fever or chills. No known allergies to medications. 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 Authorizing Provider  cetirizine (ZYRTEC) 5 MG tablet Take 5 mg by mouth daily.    Historical Provider, MD  diphenhydrAMINE (BENADRYL) 25 mg capsule Take 25 mg by mouth as needed.    Historical Provider, MD  predniSONE (DELTASONE) 20 MG tablet 3/3/2/2/1/1 single daily dose for 6 days 04/25/13    Tonye Pearson, MD    Review of Systems  Constitutional: Negative for fever and chills.  Skin: Positive for rash.    Triage Vitals: BP 118/76   Pulse 68   Temp(Src) 97.9 F (36.6 C) (Oral)   Resp 18   Ht  (1.778 m)   Wt 168 lb 9.6 oz (76.476 kg)   BMI 24.19 kg/m2   SpO2 98%  Objective:   Physical Exam  Nursing note and vitals reviewed. Constitutional: He is oriented to person, place, and time. He appears well-developed and well-nourished.  HENT:  Head: Normocephalic.  Eyes: EOM are normal.  Neck: Normal range of motion.  Pulmonary/Chest: Effort normal.  Abdominal: He exhibits no distension.  Musculoskeletal: Normal range of motion.  Neurological: He is alert and oriented to person, place, and time.  Skin: Rash noted.  Multiple papules and plaque to both sides of lumbar spine running parallel to each other  Psychiatric: He has a normal mood and affect.      Assessment & Plan:  I personally performed the services described in this documentation, which was scribed in my presence. The recorded information has been reviewed and is accurate.  Rash and other nonspecific skin eruption - Plan: B. Burgdorfi Antibodies, [place] spotted fvr ab, IgM-blood, POCT CBC, POCT SEDIMENTATION RATE,  doxycycline (VIBRA-TABS) 100 MG tablet  Signed, Gregory Sidle, MD

## 2013-09-25 LAB — ROCKY MTN SPOTTED FVR AB, IGM-BLOOD: ROCKY MTN SPOTTED FEVER, IGM: 0.16 IV

## 2013-09-25 LAB — B. BURGDORFI ANTIBODIES: B burgdorferi Ab IgG+IgM: 0.69 {ISR}

## 2013-11-24 ENCOUNTER — Ambulatory Visit (INDEPENDENT_AMBULATORY_CARE_PROVIDER_SITE_OTHER): Payer: BC Managed Care – PPO | Admitting: Family Medicine

## 2013-11-24 VITALS — BP 130/90 | HR 70 | Temp 98.3°F | Resp 16 | Ht 70.75 in | Wt 162.0 lb

## 2013-11-24 DIAGNOSIS — R350 Frequency of micturition: Secondary | ICD-10-CM

## 2013-11-24 DIAGNOSIS — R300 Dysuria: Secondary | ICD-10-CM

## 2013-11-24 LAB — POCT URINALYSIS DIPSTICK
Bilirubin, UA: NEGATIVE
Glucose, UA: NEGATIVE
KETONES UA: NEGATIVE
Leukocytes, UA: NEGATIVE
Nitrite, UA: NEGATIVE
PH UA: 5.5
Protein, UA: NEGATIVE
RBC UA: NEGATIVE
Spec Grav, UA: <=1.005
Urobilinogen, UA: 0.2

## 2013-11-24 LAB — POCT UA - MICROSCOPIC ONLY
BACTERIA, U MICROSCOPIC: NEGATIVE
CASTS, UR, LPF, POC: NEGATIVE
Crystals, Ur, HPF, POC: NEGATIVE
MUCUS UA: NEGATIVE
RBC, urine, microscopic: NEGATIVE
WBC, UR, HPF, POC: NEGATIVE
Yeast, UA: NEGATIVE

## 2013-11-24 MED ORDER — DOXYCYCLINE HYCLATE 100 MG PO TABS: 100.0000 mg | ORAL_TABLET | Freq: Two times a day (BID) | ORAL | Status: DC

## 2013-11-24 NOTE — Progress Notes (Signed)
   Subjective:    Patient ID: Gregory Petersen, male    DOB: 10/23/1955, 58 y.o.   MRN: [id number] This chart was scribed for Gregory SidleKurt Dannon Perlow, MD by Littie Deedsichard Sun, Medical Scribe. This patient was seen in Room 10 and the patient's care was started at 11:40 AM.   HPI HPI Comments: Gregory JasmineDavid W Gahm is a 58 y.o. male with a hx of prostatitis and UTI who presents to the [facility] complaining of gradual onset urinary symptoms. Patient has had to constantly get up at night to urinate and also notes burning with urination that began 2 days ago. He denies testicular pain, testicular swelling, and fever. He has had fever with prior prostatitis. 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 Patient does a lot of biking, kayaking and other outdoor activities. He is currently married and denies any extramarital sexual activity.     Review of Systems  Constitutional: Negative for fever.  HENT: Negative for ear discharge.   Eyes: Negative for discharge.  Respiratory: Negative for cough.   Cardiovascular: Negative for chest pain.  Gastrointestinal: Negative for diarrhea.  Genitourinary: Positive for dysuria and frequency. Negative for testicular pain.  Musculoskeletal: Negative for neck pain.  Skin: Negative for rash.  Neurological: Negative for seizures.  Psychiatric/Behavioral: Negative for confusion.       Objective:   Physical Exam CONSTITUTIONAL: Well developed/well nourished HEAD: Normocephalic/atraumatic EYES: EOM/PERRL ENMT: Mucous membranes moist NECK: supple no meningeal signs SPINE: entire spine nontender CV: S1/S2 noted, no murmurs/rubs/gallops noted LUNGS: Lungs are clear to auscultation bilaterally, no apparent distress ABDOMEN: soft, nontender, no rebound or guarding GU: no cva tenderness. Right  testicle small, no hernia. Normal otherwise. NEURO: Pt is awake/alert, moves all extremitiesx4 EXTREMITIES: pulses normal, full ROM SKIN: warm, color normal PSYCH: no abnormalities of mood noted      Assessment & Plan:   Urinary frequency - Plan: POCT UA - Microscopic Only, POCT urinalysis dipstick, doxycycline (VIBRA-TABS) 100 MG tablet  Burning with urination - Plan: POCT UA - Microscopic Only, POCT urinalysis dipstick, doxycycline (VIBRA-TABS) 100 MG tablet This is most consistent with prostatitis Signed, Gregory SidleKurt Evens Meno, MD

## 2014-12-24 ENCOUNTER — Encounter: Payer: Self-pay | Admitting: Internal Medicine

## 2015-04-29 DIAGNOSIS — H5203 Hypermetropia, bilateral: Secondary | ICD-10-CM | POA: Diagnosis not present

## 2015-06-11 DIAGNOSIS — L820 Inflamed seborrheic keratosis: Secondary | ICD-10-CM | POA: Diagnosis not present

## 2015-06-20 ENCOUNTER — Ambulatory Visit (INDEPENDENT_AMBULATORY_CARE_PROVIDER_SITE_OTHER): Payer: BLUE CROSS/BLUE SHIELD | Admitting: Family Medicine

## 2015-06-20 VITALS — BP 124/78 | HR 49 | Temp 97.9°F | Ht 69.75 in | Wt 168.0 lb

## 2015-06-20 DIAGNOSIS — B353 Tinea pedis: Secondary | ICD-10-CM

## 2015-06-20 MED ORDER — MUPIROCIN 2 % EX OINT: 1.0000 "application " | TOPICAL_OINTMENT | Freq: Two times a day (BID) | CUTANEOUS | Status: DC

## 2015-06-20 MED ORDER — KETOCONAZOLE 2 % EX CREA: 1.0000 "application " | TOPICAL_CREAM | Freq: Every day | CUTANEOUS | Status: DC

## 2015-06-20 MED ORDER — TERBINAFINE HCL 250 MG PO TABS: 250.0000 mg | ORAL_TABLET | Freq: Every day | ORAL | Status: DC

## 2015-06-20 MED FILL — MUPIROCIN 2% OINTMENT: 2 | 10 days supply | Qty: 22 | Fill #0

## 2015-06-20 MED FILL — TERBINAFINE HCL 250 MG TAB: 250 | 10 days supply | Qty: 10 | Fill #0

## 2015-06-20 MED FILL — KETOCONAZOLE 2% CREAM: 2 | 20 days supply | Qty: 60 | Fill #0

## 2015-06-20 NOTE — Progress Notes (Signed)
Subjective:    Patient ID: Gregory Petersen, male    DOB: 1955-01-28, 60 y.o.   MRN: [id number]  06/20/2015  Rash   HPI This 60 y.o. male presents for evaluation of possible staff infection/rash.  Developed athlete's foot one month ago while camping.  Has been applying miconazole OTC for the past 10-14 days without improvement. Rash is actually becoming more pronounced.  Has developed mild pain in the past day. History of secondary staff infection with tinea cruris in the past; thus, very worried about evolving staph infection. Denies fever/chills/sweats. Denies streaking. No localized swelling; minimal to no draining at site of rash.  Kayaking every weekend.  Swimming in lakes a lot.   Review of Systems  Constitutional: Negative for fever, chills, diaphoresis, activity change, appetite change and fatigue.  Respiratory: Negative for cough and shortness of breath.   Cardiovascular: Negative for chest pain, palpitations and leg swelling.  Gastrointestinal: Negative for nausea, vomiting, abdominal pain and diarrhea.  Endocrine: Negative for cold intolerance, heat intolerance, polydipsia, polyphagia and polyuria.  Skin: Positive for rash. Negative for color change and wound.  Neurological: Negative for dizziness, tremors, seizures, syncope, facial asymmetry, speech difficulty, weakness, light-headedness, numbness and headaches.  Psychiatric/Behavioral: Negative for sleep disturbance and dysphoric mood. The patient is not nervous/anxious.     Past Medical History  Diagnosis Date  . Hemorrhoids   . Nasal congestion   . Rectal pain   . Blood in stool   . Rectal bleeding   . MEDIAL MENISCUS TEAR, LEFT 12/22/2005    Qualifier: Diagnosis of  By: Mannie Stabile MD, JOSEPH    . Hemorrhoids, internal, with bleeding & occasional prolapse 06/17/2011  . Blood in urine   . Allergy    Past Surgical History  Procedure Laterality Date  . 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 Tonsillectomy      patient does not remember  exact date - was in childhood  . Hemorrhoid surgery     No Known Allergies  Social History   Social History  . Marital Status: Married    Spouse Name: N/A  . Number of Children: N/A  . Years of Education: N/A   Occupational History  . Not on file.   Social History Main Topics  . Smoking status: Former Smoker    Quit date: 06/16/1989  . Smokeless tobacco: Never Used  . Alcohol Use: Yes     Comment: beer - occasional  . Drug Use: No  . Sexual Activity: Yes    Birth Control/ Protection: None   Other Topics Concern  . Not on file   Social History Narrative   History reviewed. No pertinent family history.     Objective:    BP 124/78 mmHg  Pulse 49  Temp(Src) 97.9 F (36.6 C) (Oral)  Ht 5' 9.75" (1.772 m)  Wt 168 lb (76.204 kg)  BMI 24.27 kg/m2  SpO2 98% Physical Exam  Constitutional: He is oriented to person, place, and time. He appears well-developed and well-nourished. No distress.  HENT:  Head: Normocephalic and atraumatic.  Eyes: Conjunctivae and EOM are normal. Pupils are equal, round, and reactive to light.  Neck: Normal range of motion. Neck supple. Carotid bruit is not present. No thyromegaly present.  Cardiovascular: Normal rate, regular rhythm, normal heart sounds and intact distal pulses.  Exam reveals no gallop and no friction rub.   No murmur heard. Pulmonary/Chest: Effort normal and breath sounds normal. He has no wheezes. He has no rales.  Lymphadenopathy:  He has no cervical adenopathy.  Neurological: He is alert and oriented to person, place, and time. No cranial nerve deficit.  Skin: Skin is warm and dry. Rash noted. He is not diaphoretic.     +superficial rash scaling with satellite lesions and interdigit involvement; minimal erythema; four satellite lesions with scant surrounding erythema and scant clear drainage.  Psychiatric: He has a normal mood and affect. His behavior is normal.  Nursing note and vitals reviewed.  Results for orders  placed or performed in visit on 11/24/13  POCT UA - Microscopic Only  Result Value Ref Range   WBC, Ur, HPF, POC neg    RBC, urine, microscopic neg    Bacteria, U Microscopic neg    Mucus, UA neg    Epithelial cells, urine per micros 0-1    Crystals, Ur, HPF, POC neg    Casts, Ur, LPF, POC neg    Yeast, UA neg   POCT urinalysis dipstick  Result Value Ref Range   Color, UA yellow    Clarity, UA clear    Glucose, UA neg    Bilirubin, UA neg    Ketones, UA neg    Spec Grav, UA <=1.005    Blood, UA neg    pH, UA 5.5    Protein, UA neg    Urobilinogen, UA 0.2    Nitrite, UA neg    Leukocytes, UA Negative        Assessment & Plan:   1. Tinea pedis of right foot    -New. -rx for Ketoconazole cream 2% with Lamisil 250mg  one daily for ten days. -rx for Mupirocin ointment to apply bid for 7-10 days for secondary impetigo early.  RTC for pain, swelling, redness at site. -avoid swimming for one week  No orders of the defined types were placed in this encounter.   Meds ordered this encounter  Medications  . ketoconazole (NIZORAL) 2 % cream    Sig: Apply 1 application topically daily.    Dispense:  60 g    Refill:  0  . terbinafine (LAMISIL) 250 MG tablet    Sig: Take 1 tablet (250 mg total) by mouth daily.    Dispense:  10 tablet    Refill:  0  . mupirocin ointment (BACTROBAN) 2 %    Sig: Apply 1 application topically 2 (two) times daily.    Dispense:  30 g    Refill:  0    No Follow-up on file.    Trimaine Maser Paulita FujitaMartin Zyrell Carmean, M.D. [facility]  [place] [address] [place], [place]  [id number] [phone] phone [phone] fax

## 2015-06-20 NOTE — Patient Instructions (Addendum)
1.  Apply Bactroban/mupricon ointment twice daily for one week (staph infection coverage) 2.  Apply Ketoconazole cream 2-3 times per day for 3 weeks (antifungal) 3. Take Lamisil pills once daily for fungal infection.  Athlete's Foot Athlete's foot (tinea pedis) is a fungal infection of the skin on the feet. It often occurs on the skin between the toes or underneath the toes. It can also occur on the soles of the feet. Athlete's foot is more likely to occur in hot, humid weather. Not washing your feet or changing your socks often enough can contribute to athlete's foot. The infection can spread from person to person (contagious). CAUSES Athlete's foot is caused by a fungus. This fungus thrives in warm, moist places. Most people get athlete's foot by sharing shower stalls, towels, and wet floors with an infected person. People with weakened immune systems, including those with diabetes, may be more likely to get athlete's foot. SYMPTOMS   Itchy areas between the toes or on the soles of the feet.  White, flaky, or scaly areas between the toes or on the soles of the feet.  Tiny, intensely itchy blisters between the toes or on the soles of the feet.  Tiny cuts on the skin. These cuts can develop a bacterial infection.  Thick or discolored toenails. DIAGNOSIS  Your caregiver can usually tell what the problem is by doing a physical exam. Your caregiver may also take a skin sample from the rash area. The skin sample may be examined under a microscope, or it may be tested to see if fungus will grow in the sample. A sample may also be taken from your toenail for testing. TREATMENT  Over-the-counter and prescription medicines can be used to kill the fungus. These medicines are available as powders or creams. Your caregiver can suggest medicines for you. Fungal infections respond slowly to treatment. You may need to continue using your medicine for several weeks. PREVENTION   Do not share  towels.  Wear sandals in wet areas, such as shared locker rooms and shared showers.  Keep your feet dry. Wear shoes that allow air to circulate. Wear cotton or wool socks. HOME CARE INSTRUCTIONS   Take medicines as directed by your caregiver. Do not use steroid creams on athlete's foot.  Keep your feet clean and cool. Wash your feet daily and dry them thoroughly, especially between your toes.  Change your socks every day. Wear cotton or wool socks. In hot climates, you may need to change your socks 2 to 3 times per day.  Wear sandals or canvas tennis shoes with good air circulation.  If you have blisters, soak your feet in Burow's solution or Epsom salts for 20 to 30 minutes, 2 times a day to dry out the blisters. Make sure you dry your feet thoroughly afterward. SEEK MEDICAL CARE IF:   You have a fever.  You have swelling, soreness, warmth, or redness in your foot.  You are not getting better after 7 days of treatment.  You are not completely cured after 30 days.  You have any problems caused by your medicines. MAKE SURE YOU:   Understand these instructions.  Will watch your condition.  Will get help right away if you are not doing well or get worse.   This information is not intended to replace advice given to you by your health care provider. Make sure you discuss any questions you have with your health care provider.   Document Released: 01/10/2000 Document Revised: 04/06/2011 Document  Reviewed: 07/16/2014 Elsevier Interactive Patient Education [employer]     IF you received an x-ray today, you will receive an invoice from [facility]. Please contact [facility] at [phone] with questions or concerns regarding your invoice.   IF you received labwork today, you will receive an invoice from [employer]. Please contact [facility] at [phone] with questions or concerns regarding your invoice.   Our billing staff  will not be able to assist you with questions regarding bills from these companies.  You will be contacted with the lab results as soon as they are available. The fastest way to get your results is to activate your My Chart account. Instructions are located on the last page of this paperwork. If you have not heard from [place] regarding the results in 2 weeks, please contact this office.

## 2015-09-07 ENCOUNTER — Ambulatory Visit (HOSPITAL_COMMUNITY)
Admission: EM | Admit: 2015-09-07 | Discharge: 2015-09-07 | Disposition: A | Discharge status: Home / Self Care | Payer: BLUE CROSS/BLUE SHIELD | Attending: Family Medicine | Admitting: Family Medicine

## 2015-09-07 ENCOUNTER — Encounter (HOSPITAL_COMMUNITY): Payer: Self-pay | Admitting: *Deleted

## 2015-09-07 DIAGNOSIS — T63441A Toxic effect of venom of bees, accidental (unintentional), initial encounter: Secondary | ICD-10-CM | POA: Diagnosis not present

## 2015-09-07 HISTORY — DX: Disorder of kidney and ureter, unspecified: N28.9

## 2015-09-07 MED ORDER — TRIAMCINOLONE ACETONIDE 40 MG/ML IJ SUSP
INTRAMUSCULAR | Status: AC
  Filled 2015-09-07: qty 1

## 2015-09-07 MED ORDER — TRIAMCINOLONE ACETONIDE 40 MG/ML IJ SUSP
40.0000 mg | Freq: Once | INTRAMUSCULAR | Status: AC
  Administered 2015-09-07: 40 mg via INTRAMUSCULAR

## 2015-09-07 MED ORDER — EPINEPHRINE 0.3 MG/0.3ML IJ SOAJ: 0.3000 mg | Freq: Once | INTRAMUSCULAR | 1 refills | Status: AC

## 2015-09-07 MED ORDER — METHYLPREDNISOLONE ACETATE 80 MG/ML IJ SUSP
INTRAMUSCULAR | Status: AC
  Filled 2015-09-07: qty 1

## 2015-09-07 MED ORDER — METHYLPREDNISOLONE ACETATE 40 MG/ML IJ SUSP
80.0000 mg | Freq: Once | INTRAMUSCULAR | Status: AC
  Administered 2015-09-07: 80 mg via INTRAMUSCULAR

## 2015-09-07 NOTE — ED Provider Notes (Signed)
[facility]    CSN: [id number] Arrival date & time: 09/07/15  1504  First Provider Contact:  First MD Initiated Contact with Patient 09/07/15 1610        History   Chief Complaint Chief Complaint  Patient presents with  . Insect Bite    HPI Gregory Petersen is a 60 y.o. male.    Rash  Location:  Full body Quality: redness and swelling   Severity:  Moderate Onset quality:  Sudden Duration:  2 hours Progression:  Unchanged Chronicity:  New Context: insect bite/sting   Context comment:  Mult yellow jacket stings with paper wasp knee sting Relieved by:  Antihistamines Associated symptoms: no abdominal pain, no fever, no shortness of breath, no sore throat, no throat swelling, no tongue swelling and not wheezing     Past Medical History:  Diagnosis Date  . Allergy   . Blood in stool   . Blood in urine   . Hemorrhoids   . Hemorrhoids, internal, with bleeding & occasional prolapse 06/17/2011  . MEDIAL MENISCUS TEAR, LEFT 12/22/2005   Qualifier: Diagnosis of  By: Mannie Stabile MD, JOSEPH    . Nasal congestion   . Rectal bleeding   . Rectal pain   . 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 Hematuria 06/19/2011  . Hemorrhoids, internal, with bleeding & occasional prolapse 06/17/2011    Past Surgical History:  Procedure Laterality Date  . HEMORRHOID SURGERY    . 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 TONSILLECTOMY     patient does not remember exact date - was in childhood       Home Medications    Prior to Admission medications   Medication Sig Start Date End Date Taking? Authorizing Provider  cetirizine (ZYRTEC) 5 MG tablet Take 5 mg by mouth daily.    Historical Provider, MD  doxycycline (VIBRA-TABS) 100 MG tablet Take 1 tablet (100 mg total) by mouth 2 (two) times daily. Patient not taking: Reported on 06/20/2015 11/24/13   [name], MD  ketoconazole (NIZORAL) 2 % cream Apply 1  application topically daily. 06/20/15   Ethelda Chick, MD  mupirocin ointment (BACTROBAN) 2 % Apply 1 application topically 2 (two) times daily. 06/20/15   Ethelda Chick, MD  terbinafine (LAMISIL) 250 MG tablet Take 1 tablet (250 mg total) by mouth daily. 06/20/15   Ethelda Chick, MD    Family History No family history on file.  Social History Social History  Substance Use Topics  . Smoking status: Former Smoker    Quit date: 06/16/1989  . Smokeless tobacco: Never Used  . Alcohol use Yes     Comment: occasional     Allergies   Review of patient's allergies indicates no known allergies.   Review of Systems Review of Systems  Constitutional: Negative.  Negative for fever.  HENT: Negative for sore throat.   Respiratory: Negative for shortness of breath and wheezing.   Gastrointestinal: Negative for abdominal pain.  Skin: Positive for rash.  All other systems reviewed and are negative.    Physical Exam Triage Vital Signs ED Triage Vitals  Enc Vitals Group     BP 09/07/15 1554 142/81     Pulse Rate 09/07/15 1554 71     Resp 09/07/15 1554 16     Temp 09/07/15 1554 98.2 F (36.8 C)     Temp Source  09/07/15 1554 Oral     SpO2 09/07/15 1554 97 %     Weight --      Height --      Head Circumference --      Peak Flow --      Pain Score 09/07/15 1604 0     Pain Loc --      Pain Edu? --      Excl. in GC? --    No data found.   Updated Vital Signs BP 142/81 (BP Location: Right Arm)   Pulse 71   Temp 98.2 F (36.8 C) (Oral)   Resp 16   SpO2 97%   Visual Acuity Right Eye Distance:   Left Eye Distance:   Bilateral Distance:    Right Eye Near:   Left Eye Near:    Bilateral Near:     Physical Exam  Constitutional: He is oriented to person, place, and time. He appears well-developed and well-nourished. No distress.  HENT:  Mouth/Throat: Oropharynx is clear and moist.  Eyes: EOM are normal.  Neck: Normal range of motion. Neck supple.  Cardiovascular: Normal  rate, regular rhythm, normal heart sounds and intact distal pulses.   Pulmonary/Chest: Effort normal and breath sounds normal.  Abdominal: Soft. Bowel sounds are normal.  Musculoskeletal: He exhibits no edema.  Neurological: He is alert and oriented to person, place, and time.  Skin: Skin is warm and dry.  Nursing note and vitals reviewed.    UC Treatments / Results  Labs (all labs ordered are listed, but only abnormal results are displayed) Labs Reviewed - No data to display  EKG  EKG Interpretation None       Radiology No results found.  Procedures Procedures (including critical care time)  Medications Ordered in UC Medications - No data to display   Initial Impression / Assessment and Plan / UC Course  I have reviewed the triage vital signs and the nursing notes.  Pertinent labs & imaging results that were available during my care of the patient were reviewed by me and considered in my medical decision making (see chart for details).  Clinical Course      Final Clinical Impressions(s) / UC Diagnoses   Final diagnoses:  None    New Prescriptions New Prescriptions   No medications on file     Linna HoffJames D Jibran Crookshanks, MD 09/07/15 1623

## 2015-09-07 NOTE — Discharge Instructions (Signed)
Use benadryl for 1 day, return as needed.

## 2015-09-07 NOTE — ED Triage Notes (Signed)
C/O approx 30-40 bee stings @ approx 1445; took  Benadryl shortly thereafter.  Denies any throat or oral swelling or sensations.

## 2016-02-07 ENCOUNTER — Other Ambulatory Visit: Payer: Self-pay | Admitting: Family Medicine

## 2016-02-09 NOTE — Telephone Encounter (Signed)
Last ov and refill 05/2015

## 2016-02-10 MED FILL — KETOCONAZOLE 2% CREAM: 2 | 20 days supply | Qty: 60 | Fill #0

## 2016-02-11 DIAGNOSIS — L821 Other seborrheic keratosis: Secondary | ICD-10-CM | POA: Diagnosis not present

## 2016-07-22 DIAGNOSIS — H3562 Retinal hemorrhage, left eye: Secondary | ICD-10-CM | POA: Diagnosis not present

## 2016-07-22 DIAGNOSIS — H524 Presbyopia: Secondary | ICD-10-CM | POA: Diagnosis not present

## 2016-08-19 DIAGNOSIS — H3582 Retinal ischemia: Secondary | ICD-10-CM | POA: Diagnosis not present

## 2016-08-19 DIAGNOSIS — H34832 Tributary (branch) retinal vein occlusion, left eye, with macular edema: Secondary | ICD-10-CM | POA: Diagnosis not present

## 2016-08-19 DIAGNOSIS — H3562 Retinal hemorrhage, left eye: Secondary | ICD-10-CM | POA: Diagnosis not present

## 2016-08-19 DIAGNOSIS — H35361 Drusen (degenerative) of macula, right eye: Secondary | ICD-10-CM | POA: Diagnosis not present

## 2016-09-16 ENCOUNTER — Encounter: Payer: Self-pay | Admitting: Family Medicine

## 2016-09-16 ENCOUNTER — Ambulatory Visit (INDEPENDENT_AMBULATORY_CARE_PROVIDER_SITE_OTHER): Payer: BLUE CROSS/BLUE SHIELD | Admitting: Family Medicine

## 2016-09-16 VITALS — BP 130/82 | HR 71 | Temp 97.8°F | Resp 16 | Ht 70.08 in | Wt 170.0 lb

## 2016-09-16 DIAGNOSIS — H34232 Retinal artery branch occlusion, left eye: Secondary | ICD-10-CM

## 2016-09-16 DIAGNOSIS — Z23 Encounter for immunization: Secondary | ICD-10-CM | POA: Diagnosis not present

## 2016-09-16 DIAGNOSIS — Z0000 Encounter for general adult medical examination without abnormal findings: Secondary | ICD-10-CM | POA: Diagnosis not present

## 2016-09-16 DIAGNOSIS — Z13220 Encounter for screening for lipoid disorders: Secondary | ICD-10-CM

## 2016-09-16 DIAGNOSIS — Z1159 Encounter for screening for other viral diseases: Secondary | ICD-10-CM

## 2016-09-16 DIAGNOSIS — Z114 Encounter for screening for human immunodeficiency virus [HIV]: Secondary | ICD-10-CM

## 2016-09-16 DIAGNOSIS — Z125 Encounter for screening for malignant neoplasm of prostate: Secondary | ICD-10-CM | POA: Diagnosis not present

## 2016-09-16 DIAGNOSIS — Z131 Encounter for screening for diabetes mellitus: Secondary | ICD-10-CM | POA: Diagnosis not present

## 2016-09-16 LAB — POCT URINALYSIS DIP (MANUAL ENTRY)
BILIRUBIN UA: NEGATIVE
Blood, UA: NEGATIVE
GLUCOSE UA: NEGATIVE mg/dL
Ketones, POC UA: NEGATIVE mg/dL
LEUKOCYTES UA: NEGATIVE
Nitrite, UA: NEGATIVE
PH UA: 5.5 (ref 5.0–8.0)
Protein Ur, POC: NEGATIVE mg/dL
Spec Grav, UA: 1.01 (ref 1.010–1.025)
UROBILINOGEN UA: 0.2 U/dL

## 2016-09-16 NOTE — Patient Instructions (Addendum)
   IF you received an x-ray today, you will receive an invoice from [facility]. Please contact [facility] at [phone] with questions or concerns regarding your invoice.   IF you received labwork today, you will receive an invoice from [facility]. Please contact [facility] at [phone] with questions or concerns regarding your invoice.   Our billing staff will not be able to assist you with questions regarding bills from these companies.  You will be contacted with the lab results as soon as they are available. The fastest way to get your results is to activate your My Chart account. Instructions are located on the last page of this paperwork. If you have not heard from us regarding the results in 2 weeks, please contact this office.      Preventive Care 40-64 Years, Male Preventive care refers to lifestyle choices and visits with your health care provider that can promote health and wellness. What does preventive care include?  A yearly physical exam. This is also called an annual well check.  Dental exams once or twice a year.  Routine eye exams. Ask your health care provider how often you should have your eyes checked.  Personal lifestyle choices, including: ? Daily care of your teeth and gums. ? Regular physical activity. ? Eating a healthy diet. ? Avoiding tobacco and drug use. ? Limiting alcohol use. ? Practicing safe sex. ? Taking low-dose aspirin every day starting at age 50. What happens during an annual well check? The services and screenings done by your health care provider during your annual well check will depend on your age, overall health, lifestyle risk factors, and family history of disease. Counseling Your health care provider may ask you questions about your:  Alcohol use.  Tobacco use.  Drug use.  Emotional well-being.  Home and relationship well-being.  Sexual activity.  Eating habits.  Work and work  environment.  Screening You may have the following tests or measurements:  Height, weight, and BMI.  Blood pressure.  Lipid and cholesterol levels. These may be checked every 5 years, or more frequently if you are over 50 years old.  Skin check.  Lung cancer screening. You may have this screening every year starting at age 55 if you have a 30-pack-year history of smoking and currently smoke or have quit within the past 15 years.  Fecal occult blood test (FOBT) of the stool. You may have this test every year starting at age 50.  Flexible sigmoidoscopy or colonoscopy. You may have a sigmoidoscopy every 5 years or a colonoscopy every 10 years starting at age 50.  Prostate cancer screening. Recommendations will vary depending on your family history and other risks.  Hepatitis C blood test.  Hepatitis B blood test.  Sexually transmitted disease (STD) testing.  Diabetes screening. This is done by checking your blood sugar (glucose) after you have not eaten for a while (fasting). You may have this done every 1-3 years.  Discuss your test results, treatment options, and if necessary, the need for more tests with your health care provider. Vaccines Your health care provider may recommend certain vaccines, such as:  Influenza vaccine. This is recommended every year.  Tetanus, diphtheria, and acellular pertussis (Tdap, Td) vaccine. You may need a Td booster every 10 years.  Varicella vaccine. You may need this if you have not been vaccinated.  Zoster vaccine. You may need this after age 60.  Measles, mumps, and rubella (MMR) vaccine. You may need at least one dose   of MMR if you were born in 1957 or later. You may also need a second dose.  Pneumococcal 13-valent conjugate (PCV13) vaccine. You may need this if you have certain conditions and have not been vaccinated.  Pneumococcal polysaccharide (PPSV23) vaccine. You may need one or two doses if you smoke cigarettes or if you have  certain conditions.  Meningococcal vaccine. You may need this if you have certain conditions.  Hepatitis A vaccine. You may need this if you have certain conditions or if you travel or work in places where you may be exposed to hepatitis A.  Hepatitis B vaccine. You may need this if you have certain conditions or if you travel or work in places where you may be exposed to hepatitis B.  Haemophilus influenzae type b (Hib) vaccine. You may need this if you have certain risk factors.  Talk to your health care provider about which screenings and vaccines you need and how often you need them. This information is not intended to replace advice given to you by your health care provider. Make sure you discuss any questions you have with your health care provider. Document Released: 02/08/2015 Document Revised: 10/02/2015 Document Reviewed: 11/13/2014 Elsevier Interactive Patient Education  2017 [employer]  

## 2016-09-16 NOTE — Progress Notes (Signed)
Subjective:    Patient ID: Gregory Petersen, male    DOB: 1955-12-30, 61 y.o.   MRN: [id number]  HPI This 61 y.o. male presents for Routine Physical examination  Last physical: not sure; does not remember; 2001.  Colonoscopy:  [facility] in 2010.  Colon polyps.  Overdue.   Eye exam: 2018 Dental exam: every six months   Retinal hemorrhage: in May 2018.  Recommended evaluation by primary care provider; retinal arterial occlusion LEFT.   Retinal specialist recently; looked like varicose vein.  Then looked like a bruise.  Would be driving would see something in peripheral vision.    There is no immunization history on file for this patient. BP Readings from Last 3 Encounters:  09/16/16 130/82  09/07/15 142/81  06/20/15 124/78   Wt Readings from Last 3 Encounters:  09/16/16 170 lb (77.1 kg)  06/20/15 168 lb (76.2 kg)  11/24/13 162 lb (73.5 kg)     Visual Acuity Screening   Right eye Left eye Both eyes  Without correction:     With correction: '20/20 20/25 20/20 '$    Review of Systems  Constitutional: Negative for activity change, appetite change, chills, diaphoresis, fatigue, fever and unexpected weight change.  HENT: Negative for congestion, dental problem, drooling, ear discharge, ear pain, facial swelling, hearing loss, mouth sores, nosebleeds, postnasal drip, rhinorrhea, sinus pressure, sneezing, sore throat, tinnitus, trouble swallowing and voice change.   Eyes: Positive for visual disturbance. Negative for photophobia, pain, discharge, redness and itching.  Respiratory: Negative for apnea, cough, choking, chest tightness, shortness of breath, wheezing and stridor.   Cardiovascular: Negative for chest pain, palpitations and leg swelling.  Gastrointestinal: Negative for abdominal pain, blood in stool, constipation, diarrhea, nausea and vomiting.  Endocrine: Negative for cold intolerance, heat intolerance, polydipsia, polyphagia and polyuria.  Genitourinary: Positive for frequency.  Negative for decreased urine volume, difficulty urinating, discharge, dysuria, enuresis, flank pain, genital sores, hematuria, penile pain, penile swelling, scrotal swelling, testicular pain and urgency.  Musculoskeletal: Positive for arthralgias. Negative for back pain, gait problem, joint swelling, myalgias, neck pain and neck stiffness.  Skin: Negative for color change, pallor, rash and wound.  Allergic/Immunologic: Positive for environmental allergies. Negative for food allergies and immunocompromised state.  Neurological: Negative for dizziness, tremors, seizures, syncope, facial asymmetry, speech difficulty, weakness, light-headedness, numbness and headaches.  Hematological: Negative for adenopathy. Does not bruise/bleed easily.  Psychiatric/Behavioral: Negative for agitation, behavioral problems, confusion, decreased concentration, dysphoric mood, hallucinations, self-injury, sleep disturbance and suicidal ideas. The patient is not nervous/anxious and is not hyperactive.    Past Medical History:  Diagnosis Date  . Allergy   . Blood in stool   . Blood in urine   . Hemorrhoids   . Hemorrhoids, internal, with bleeding & occasional prolapse 06/17/2011  . MEDIAL MENISCUS TEAR, LEFT 12/22/2005   Qualifier: Diagnosis of  By: Laverle Hobby MD, JOSEPH    . Nasal congestion   . Rectal bleeding   . Rectal pain   . 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 HEMORRHOID SURGERY    . 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 TONSILLECTOMY     patient does not remember exact date - was in childhood   No Known Allergies Social History   Social History  . Marital status: Married    Spouse name: N/A  . Number of children: 1  . Years of education: N/A   Occupational  History  . Not on file.   Social History Main Topics  . Smoking status: Former Smoker    Quit date: 06/16/1989  . Smokeless tobacco: Never Used  . Alcohol use Yes      Comment: occasional  . Drug use: No  . Sexual activity: Yes    Birth control/ protection: None   Other Topics Concern  . Not on file   Social History Narrative   Marital status: married x 1985      Children: 1 daughter (64), 2 grandchildren      Lives: with wife      Employment: Public affairs consultant; worked in [employer] x 22 years      Tobacco: none      Alcohol:  5-10 drinks per week. Craft beers      Exercise: gym 3-4 times per week; rides a bike daily.  Rock climbing.  [employer].    Family History  Problem Relation Age of Onset  . Heart disease Mother   . Hypertension Mother   . Heart disease Father 73       AMI as cause of death  . Diabetes Father   . COPD Brother   . Tuberous sclerosis Brother        Objective:   Physical Exam  Constitutional: He is oriented to person, place, and time. He appears well-developed and well-nourished. No distress.  HENT:  Head: Normocephalic and atraumatic.  Right Ear: External ear normal.  Left Ear: External ear normal.  Nose: Nose normal.  Mouth/Throat: Oropharynx is clear and moist.  Eyes: Pupils are equal, round, and reactive to light. Conjunctivae and EOM are normal.  Neck: Normal range of motion. Neck supple. Carotid bruit is not present. No thyromegaly present.  Cardiovascular: Normal rate, regular rhythm, normal heart sounds and intact distal pulses.  Exam reveals no gallop and no friction rub.   No murmur heard. Pulmonary/Chest: Effort normal and breath sounds normal. He has no wheezes. He has no rales.  Abdominal: Soft. Bowel sounds are normal. He exhibits no distension and no mass. There is no tenderness. There is no rebound and no guarding. Hernia confirmed negative in the right inguinal area and confirmed negative in the left inguinal area.  Genitourinary: Rectum normal, prostate normal, testes normal and penis normal. Rectal exam shows no external hemorrhoid.  Musculoskeletal:       Right shoulder: Normal.        Left shoulder: Normal.       Cervical back: Normal.  Lymphadenopathy:    He has no cervical adenopathy.  Neurological: He is alert and oriented to person, place, and time. He has normal reflexes. No cranial nerve deficit. He exhibits normal muscle tone. Coordination normal.  Skin: Skin is warm and dry. No rash noted. He is not diaphoretic.  Psychiatric: He has a normal mood and affect. His behavior is normal. Judgment and thought content normal.   Depression screen [facility] 2/9 09/16/2016 06/20/2015  Decreased Interest 0 0  Down, Depressed, Hopeless 0 0  PHQ - 2 Score 0 0   Fall Risk  09/16/2016 06/20/2015  Falls in the past year? No No        Assessment & Plan:   1. Routine physical examination   2. Screening for diabetes mellitus   3. Screening, lipid   4. Screening for prostate cancer   5. Need for Tdap vaccination   6. Retinal artery occlusion, branch, left   7. Screening for HIV (human immunodeficiency virus)  8. Need for hepatitis C screening test     -anticipatory guidance provided --- exercise, weight loss, safe driving practices, aspirin '81mg'$  daily. -obtain age appropriate screening labs and labs for chronic disease management. -new onset retinal artery occlusion; refer for carotid doppler; obtain screening labs. Also warrants EKG obtained during visit today, carotid dopplers, ESR, and coagulopathy work up.  Start ASA '81mg'$  daily for secondary prevention.   Orders Placed This Encounter  Procedures  . US Carotid Duplex Bilateral  . Tdap vaccine greater than or equal to 7yo IM  . CBC with Differential/Platelet  . Comprehensive metabolic panel  . Hemoglobin A1c  . Hepatitis C antibody  . HIV antibody  . Lipid panel  . PSA  . TSH  . POCT urinalysis dipstick  . EKG 12-Lead   No orders of the defined types were placed in this encounter.  Malique Driskill Elayne Guerin, M.D. Primary Care at [facility] previously [facility] [place] [address] [place], [place]  [phone] phone [phone] fax

## 2016-09-17 LAB — COMPREHENSIVE METABOLIC PANEL
A/G RATIO: 2 (ref 1.2–2.2)
ALBUMIN: 4.6 g/dL (ref 3.5–5.5)
ALK PHOS: 72 IU/L (ref 39–117)
ALT: 24 IU/L (ref 0–44)
AST: 27 IU/L (ref 0–40)
BILIRUBIN TOTAL: 0.4 mg/dL (ref 0.0–1.2)
BUN/Creatinine Ratio: 12 (ref 9–20)
BUN: 12 mg/dL (ref 6–24)
CHLORIDE: 103 mmol/L (ref 96–106)
CO2: 20 mmol/L (ref 20–29)
Calcium: 9.5 mg/dL (ref 8.7–10.2)
Creatinine, Ser: 1 mg/dL (ref 0.76–1.27)
GFR calc non Af Amer: 82 mL/min/{1.73_m2} (ref >59)
GFR, EST AFRICAN AMERICAN: 95 mL/min/{1.73_m2} (ref >59)
GLUCOSE: 105 mg/dL — AB (ref 65–99)
Globulin, Total: 2.3 g/dL (ref 1.5–4.5)
POTASSIUM: 4.6 mmol/L (ref 3.5–5.2)
Sodium: 143 mmol/L (ref 134–144)
TOTAL PROTEIN: 6.9 g/dL (ref 6.0–8.5)

## 2016-09-17 LAB — CBC WITH DIFFERENTIAL/PLATELET
BASOS ABS: 0 10*3/uL (ref 0.0–0.2)
BASOS: 1 %
EOS (ABSOLUTE): 0.2 10*3/uL (ref 0.0–0.4)
Eos: 4 %
HEMOGLOBIN: 13 g/dL (ref 13.0–17.7)
Hematocrit: 41.4 % (ref 37.5–51.0)
IMMATURE GRANS (ABS): 0 10*3/uL (ref 0.0–0.1)
Immature Granulocytes: 0 %
LYMPHS: 28 %
Lymphocytes Absolute: 1.3 10*3/uL (ref 0.7–3.1)
MCH: 26.9 pg (ref 26.6–33.0)
MCHC: 31.4 g/dL — ABNORMAL LOW (ref 31.5–35.7)
MCV: 86 fL (ref 79–97)
MONOCYTES: 7 %
Monocytes Absolute: 0.3 10*3/uL (ref 0.1–0.9)
NEUTROS ABS: 2.9 10*3/uL (ref 1.4–7.0)
Neutrophils: 60 %
Platelets: 209 10*3/uL (ref 150–379)
RBC: 4.83 x10E6/uL (ref 4.14–5.80)
RDW: 16.1 % — ABNORMAL HIGH (ref 12.3–15.4)
WBC: 4.8 10*3/uL (ref 3.4–10.8)

## 2016-09-17 LAB — TSH: TSH: 1.84 u[IU]/mL (ref 0.450–4.500)

## 2016-09-17 LAB — PSA: PROSTATE SPECIFIC AG, SERUM: 2.5 ng/mL (ref 0.0–4.0)

## 2016-09-17 LAB — LIPID PANEL
CHOL/HDL RATIO: 4 ratio (ref 0.0–5.0)
Cholesterol, Total: 245 mg/dL — ABNORMAL HIGH (ref 100–199)
HDL: 61 mg/dL (ref >39)
LDL Calculated: 153 mg/dL — ABNORMAL HIGH (ref 0–99)
Triglycerides: 156 mg/dL — ABNORMAL HIGH (ref 0–149)
VLDL Cholesterol Cal: 31 mg/dL (ref 5–40)

## 2016-09-17 LAB — HEMOGLOBIN A1C
ESTIMATED AVERAGE GLUCOSE: 117 mg/dL
HEMOGLOBIN A1C: 5.7 % — AB (ref 4.8–5.6)

## 2016-09-17 LAB — HIV ANTIBODY (ROUTINE TESTING W REFLEX): HIV SCREEN 4TH GENERATION: NONREACTIVE

## 2016-09-17 LAB — HEPATITIS C ANTIBODY: HEP C VIRUS AB: 0.1 {s_co_ratio} (ref 0.0–0.9)

## 2017-02-19 ENCOUNTER — Inpatient Hospital Stay (HOSPITAL_COMMUNITY)
Admission: EM | Admit: 2017-02-19 | Discharge: 2017-02-21 | DRG: 988 | Disposition: A | Discharge status: Home / Self Care | Payer: BLUE CROSS/BLUE SHIELD | Attending: Family Medicine | Admitting: Family Medicine

## 2017-02-19 ENCOUNTER — Other Ambulatory Visit: Payer: Self-pay

## 2017-02-19 ENCOUNTER — Encounter (HOSPITAL_COMMUNITY): Payer: Self-pay

## 2017-02-19 ENCOUNTER — Encounter: Payer: Self-pay | Admitting: Family Medicine

## 2017-02-19 ENCOUNTER — Emergency Department (HOSPITAL_COMMUNITY): Payer: BLUE CROSS/BLUE SHIELD

## 2017-02-19 ENCOUNTER — Ambulatory Visit: Payer: BLUE CROSS/BLUE SHIELD | Admitting: Family Medicine

## 2017-02-19 VITALS — BP 150/98 | HR 73 | Temp 98.2°F | Resp 18 | Ht 70.75 in | Wt 175.0 lb

## 2017-02-19 DIAGNOSIS — S0083XA Contusion of other part of head, initial encounter: Secondary | ICD-10-CM | POA: Diagnosis not present

## 2017-02-19 DIAGNOSIS — S0093XA Contusion of unspecified part of head, initial encounter: Secondary | ICD-10-CM

## 2017-02-19 DIAGNOSIS — S199XXA Unspecified injury of neck, initial encounter: Secondary | ICD-10-CM | POA: Diagnosis not present

## 2017-02-19 DIAGNOSIS — Q600 Renal agenesis, unilateral: Secondary | ICD-10-CM | POA: Diagnosis not present

## 2017-02-19 DIAGNOSIS — S0121XA Laceration without foreign body of nose, initial encounter: Secondary | ICD-10-CM | POA: Diagnosis present

## 2017-02-19 DIAGNOSIS — R51 Headache: Secondary | ICD-10-CM | POA: Diagnosis not present

## 2017-02-19 DIAGNOSIS — S0001XA Abrasion of scalp, initial encounter: Secondary | ICD-10-CM

## 2017-02-19 DIAGNOSIS — W1830XA Fall on same level, unspecified, initial encounter: Secondary | ICD-10-CM | POA: Diagnosis present

## 2017-02-19 DIAGNOSIS — R55 Syncope and collapse: Principal | ICD-10-CM | POA: Diagnosis present

## 2017-02-19 DIAGNOSIS — S0180XA Unspecified open wound of other part of head, initial encounter: Secondary | ICD-10-CM | POA: Diagnosis not present

## 2017-02-19 DIAGNOSIS — Z8249 Family history of ischemic heart disease and other diseases of the circulatory system: Secondary | ICD-10-CM | POA: Diagnosis not present

## 2017-02-19 DIAGNOSIS — Z79899 Other long term (current) drug therapy: Secondary | ICD-10-CM

## 2017-02-19 DIAGNOSIS — I4581 Long QT syndrome: Secondary | ICD-10-CM | POA: Diagnosis not present

## 2017-02-19 DIAGNOSIS — G453 Amaurosis fugax: Secondary | ICD-10-CM | POA: Diagnosis present

## 2017-02-19 DIAGNOSIS — R42 Dizziness and giddiness: Secondary | ICD-10-CM

## 2017-02-19 DIAGNOSIS — Z905 Acquired absence of kidney: Secondary | ICD-10-CM

## 2017-02-19 DIAGNOSIS — M542 Cervicalgia: Secondary | ICD-10-CM | POA: Diagnosis not present

## 2017-02-19 DIAGNOSIS — IMO0002 Reserved for concepts with insufficient information to code with codable children: Secondary | ICD-10-CM

## 2017-02-19 DIAGNOSIS — Y92002 Bathroom of unspecified non-institutional (private) residence single-family (private) house as the place of occurrence of the external cause: Secondary | ICD-10-CM | POA: Diagnosis not present

## 2017-02-19 DIAGNOSIS — H539 Unspecified visual disturbance: Secondary | ICD-10-CM | POA: Diagnosis not present

## 2017-02-19 DIAGNOSIS — Z87891 Personal history of nicotine dependence: Secondary | ICD-10-CM

## 2017-02-19 DIAGNOSIS — I340 Nonrheumatic mitral (valve) insufficiency: Secondary | ICD-10-CM | POA: Diagnosis not present

## 2017-02-19 LAB — POCT CBC
GRANULOCYTE PERCENT: 78.6 % (ref 37–80)
HCT, POC: 39.6 % — AB (ref 43.5–53.7)
HEMOGLOBIN: 12.8 g/dL — AB (ref 14.1–18.1)
Lymph, poc: 1.1 (ref 0.6–3.4)
MCH: 27.5 pg (ref 27–31.2)
MCHC: 33.2 g/dL (ref 31.8–35.4)
MCV: 82.7 fL (ref 80–97)
MID (CBC): 0.3 (ref 0–0.9)
MPV: 7.2 fL (ref 0–99.8)
POC GRANULOCYTE: 5.3 (ref 2–6.9)
POC LYMPH PERCENT: 16.3 %L (ref 10–50)
POC MID %: 5.1 %M (ref 0–12)
Platelet Count, POC: 268 10*3/uL (ref 142–424)
RBC: 4.67 M/uL — AB (ref 4.69–6.13)
RDW, POC: 15.7 %
WBC: 6.7 10*3/uL (ref 4.6–10.2)

## 2017-02-19 LAB — I-STAT TROPONIN, ED
TROPONIN I, POC: 0 ng/mL (ref 0.00–0.08)
Troponin i, poc: 0 ng/mL (ref 0.00–0.08)

## 2017-02-19 LAB — LIPID PANEL
Cholesterol: 197 mg/dL (ref 0–200)
HDL: 49 mg/dL (ref >40)
LDL Cholesterol: 125 mg/dL — ABNORMAL HIGH (ref 0–99)
Total CHOL/HDL Ratio: 4 RATIO
Triglycerides: 114 mg/dL (ref <150)
VLDL: 23 mg/dL (ref 0–40)

## 2017-02-19 LAB — CBC
HCT: 40.1 % (ref 39.0–52.0)
HEMOGLOBIN: 13 g/dL (ref 13.0–17.0)
MCH: 27.2 pg (ref 26.0–34.0)
MCHC: 32.4 g/dL (ref 30.0–36.0)
MCV: 83.9 fL (ref 78.0–100.0)
PLATELETS: 248 10*3/uL (ref 150–400)
RBC: 4.78 MIL/uL (ref 4.22–5.81)
RDW: 15.4 % (ref 11.5–15.5)
WBC: 7 10*3/uL (ref 4.0–10.5)

## 2017-02-19 LAB — BASIC METABOLIC PANEL
ANION GAP: 12 (ref 5–15)
BUN / CREAT RATIO: 13 (ref 10–24)
BUN: 12 mg/dL (ref 6–20)
BUN: 12 mg/dL (ref 8–27)
CHLORIDE: 106 mmol/L (ref 101–111)
CO2: 19 mmol/L — AB (ref 20–29)
CO2: 20 mmol/L — AB (ref 22–32)
CREATININE: 0.92 mg/dL (ref 0.76–1.27)
CREATININE: 0.95 mg/dL (ref 0.61–1.24)
Calcium: 9.3 mg/dL (ref 8.9–10.3)
Calcium: 9.5 mg/dL (ref 8.6–10.2)
Chloride: 103 mmol/L (ref 96–106)
GFR calc non Af Amer: >60 mL/min (ref >60)
GFR, EST AFRICAN AMERICAN: 104 mL/min/{1.73_m2} (ref >59)
GFR, EST NON AFRICAN AMERICAN: 90 mL/min/{1.73_m2} (ref >59)
Glucose, Bld: 158 mg/dL — ABNORMAL HIGH (ref 65–99)
Glucose: 109 mg/dL — ABNORMAL HIGH (ref 65–99)
POTASSIUM: 4.6 mmol/L (ref 3.5–5.2)
Potassium: 4.7 mmol/L (ref 3.5–5.1)
SODIUM: 138 mmol/L (ref 135–145)
Sodium: 140 mmol/L (ref 134–144)

## 2017-02-19 LAB — URINALYSIS, ROUTINE W REFLEX MICROSCOPIC
Bilirubin Urine: NEGATIVE
GLUCOSE, UA: NEGATIVE mg/dL
Hgb urine dipstick: NEGATIVE
Ketones, ur: NEGATIVE mg/dL
LEUKOCYTES UA: NEGATIVE
Nitrite: NEGATIVE
PROTEIN: NEGATIVE mg/dL
SPECIFIC GRAVITY, URINE: 1.011 (ref 1.005–1.030)
pH: 5 (ref 5.0–8.0)

## 2017-02-19 LAB — CBG MONITORING, ED: GLUCOSE-CAPILLARY: 150 mg/dL — AB (ref 65–99)

## 2017-02-19 LAB — GLUCOSE, POCT (MANUAL RESULT ENTRY): POC Glucose: 105 mg/dl — AB (ref 70–99)

## 2017-02-19 LAB — TSH: TSH: 0.907 u[IU]/mL (ref 0.350–4.500)

## 2017-02-19 MED ORDER — IBUPROFEN 200 MG PO TABS
400.0000 mg | ORAL_TABLET | ORAL | Status: DC | PRN
  Administered 2017-02-19 – 2017-02-20 (×2): 400 mg via ORAL
  Filled 2017-02-19 (×2): qty 1

## 2017-02-19 MED ORDER — ACETAMINOPHEN 325 MG PO TABS
650.0000 mg | ORAL_TABLET | Freq: Once | ORAL | Status: AC
  Administered 2017-02-19: 650 mg via ORAL
  Filled 2017-02-19: qty 2

## 2017-02-19 MED ORDER — FENTANYL CITRATE (PF) 100 MCG/2ML IJ SOLN
50.0000 ug | Freq: Once | INTRAMUSCULAR | Status: AC
  Administered 2017-02-19: 50 ug via INTRAVENOUS
  Filled 2017-02-19: qty 2

## 2017-02-19 MED ORDER — SODIUM CHLORIDE 0.9 % IV BOLUS (SEPSIS)
1000.0000 mL | Freq: Once | INTRAVENOUS | Status: AC
  Administered 2017-02-19: 1000 mL via INTRAVENOUS

## 2017-02-19 MED ORDER — LIDOCAINE HCL (PF) 1 % IJ SOLN
5.0000 mL | Freq: Once | INTRAMUSCULAR | Status: AC
  Administered 2017-02-19: 5 mL
  Filled 2017-02-19: qty 5

## 2017-02-19 NOTE — ED Triage Notes (Signed)
Pt endorses going to the bathroom this morning and then became dizzin  "and everything went black and I woke up in the kitchen floor" Pt's wife heard pt fall first time, then pt stood up and fell again and hit head on the front and back. Not on blood thinners. Axox4 and denies dizziness at this time.

## 2017-02-19 NOTE — ED Notes (Signed)
Paged Family Med team for patient requesting Ibuprofen.

## 2017-02-19 NOTE — ED Notes (Signed)
ED Provider at bedside. 

## 2017-02-19 NOTE — H&P (Signed)
Family Medicine [facility] Admission History and Physical Service Pager: [phone]  Patient name: Gregory Petersen Medical record number: [id number] Date of birth: May 03, 1955 Age: 62 y.o. Gender: male  Primary Care Provider: Ethelda Chick, MD Consultants: none Code Status: FULL   Chief Complaint: syncope and fall  Assessment and Plan: Gregory Petersen is a 62 y.o. male presenting with a syncopal episode and fall.   Syncope Patient with described syncopal event leading to fall at home. Patient with very little risk factors for cardiac problems in term of past medical history. Symptoms not consistent with seizure given wife did not witness any twitching, tongue-biting, or incontinence. CT head without abnormality and no focal weakness or slurred speech so low reason to suspect stroke. Only abnormality is a non-specific t wave abnormality on ekg which is new onset from previous ekg earlier today at PCP office. Will proceed with full syncopal workup. Repeat EKG in am, echocardiogram, orthostatics. Patient also endorses a visual deficit in the past which he was sent to a retinal specialist for and bilateral carotid dopplers ordered.  Patient did not end up getting this done as symptoms had resolved, however will also proceed with this as inpatient as may be related to current syncopal episode.  - admit to inpatient family medicine, attending Dr. Jennette Kettle, appropriate for telemetry - vital signs per floor routine - tylenol prn for pain, headache - repeat ekg in am - ECHO - orthostatics - bilateral carotid dopplers - heart healthy diet - lovenox for dvt ppx - risk stratification labs- TSH, lipid panel, A1C - cardiac monitoring - continuous pulse ox  Non-specific T-wave abnormality Unclear etiology given nature. Will place on telemetry and monitor.  -Repeat EKG in am to compare -If worsens, or develops into more serious finding could consider cardiology consult.  ? Ophthalmic Artery  Occlusion Symptoms have resolved per patient. Has been previously evaluated for this by retinal specialist. Will get carotid dopplers for syncope workup although I doubt this will evolve into a problem that will need to be addressed as an inpatient.  PMH is significant for hematuria, hemorrhoids, unspecified vision abnormality, perforated eardrum.   FEN/GI: heart healthy Prophylaxis: lovenox  Disposition: home   History of Present Illness:  Gregory Petersen is a 62 y.o. male presenting with an episode of syncope resulting in fall in the am of 1/25. He states that he wake up in the early morning of 1/25 to use the restroom. While he was urinating he states that he developed tunnel vision. The next thing he remembers is he woke upon the floor with his face bleeding 2/2 to a fall. He did not endorse any incontinence or prodrome aside from the tunnel vision previously described. He was in his usual state of health until this morning. He rode his bicycle and lifted weights 1/24.  After he came to he went to his PCP for further workup. He was sent to the emergency department for further workup. He has not had anymore episodes of this syncope since the am of 1/25. Of note the patient was seen in 2018 by a retinal specialist because he had started seeing things in his peripheral vision. This was felt to be a branch occlusion. He was going to get a carotid doppler but his symptoms resolved and he never received the carotid ultrasound.  Workup in the ed consisted of an ekg, bmp, cbc, ua, CT C-spine, CT- Head, 12 lead ekg. All workup was negative aside from the ekg  which showed non-specific t-wave inversions.  Review Of Systems: Per HPI with the following additions:   Review of Systems  Constitutional: Negative for chills and fever.  HENT: Negative for congestion and tinnitus.   Eyes: Negative for blurred vision and double vision.  Respiratory: Negative for cough, shortness of breath and wheezing.    Cardiovascular: Negative for chest pain, palpitations and leg swelling.  Gastrointestinal: Negative for abdominal pain, constipation, diarrhea, nausea and vomiting.  Genitourinary: Negative for dysuria, frequency, hematuria and urgency.  Neurological: Positive for dizziness and headaches. Negative for focal weakness and weakness.   Patient Active Problem List   Diagnosis Date Noted  . Syncope 02/19/2017  . Nasal laceration, initial encounter   . Vision abnormalities   . Hematuria 06/19/2011  . Hemorrhoids, internal, with bleeding & occasional prolapse 06/17/2011    Past Medical History: Past Medical History:  Diagnosis Date  . Allergy   . Blood in stool   . Blood in urine   . Hemorrhoids   . Hemorrhoids, internal, with bleeding & occasional prolapse 06/17/2011  . MEDIAL MENISCUS TEAR, LEFT 12/22/2005   Qualifier: Diagnosis of  By: Mannie Stabile MD, JOSEPH    . Nasal congestion   . Rectal bleeding   . Rectal pain   . 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 HEMORRHOID SURGERY    . 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 TONSILLECTOMY     patient does not remember exact date - was in childhood    Social History: Social History   Tobacco Use  . Smoking status: Former Smoker    Last attempt to quit: 06/16/1989    Years since quitting: 27.7  . Smokeless tobacco: Never Used  Substance Use Topics  . Alcohol use: Yes    Comment: occasional  . Drug use: No   Additional social history: Lives at home with wife, works as a Facilities manager, never smoker, alcohol use: 3-4 beers/day with dinner, no other drug use  Please also refer to relevant sections of EMR.  Family History: Family History  Problem Relation Age of Onset  . Heart disease Mother   . Hypertension Mother   . Heart disease Father 60       AMI as cause of death  . Diabetes Father   . COPD Brother   . Tuberous sclerosis  Brother    Brother with WPW syndrome.   Allergies and Medications: No Known Allergies Current Facility-Administered Medications on File Prior to Encounter  Medication Dose Route Frequency Provider Last Rate Last Dose  . albuterol (PROVENTIL) (2.5 MG/3ML) 0.083% nebulizer solution 2.5 mg  2.5 mg Nebulization Once Collene Gobble, MD       Current Outpatient Medications on File Prior to Encounter  Medication Sig Dispense Refill  . Melatonin 5 MG TABS Take 5 mg by mouth at bedtime.    . naproxen sodium (ALEVE) 220 MG tablet Take 440 mg by mouth as needed (pain).    Marland Kitchen EPINEPHrine 0.3 mg/0.3 mL IJ SOAJ injection Inject 0.3 mLs (0.3 mg total) into the muscle once. 1 Device 1  . ketoconazole (NIZORAL) 2 % cream APPLY TO AFFECTED AREA(S) DAILY (Patient not taking: Reported on 02/19/2017) 60 g 0    Objective: BP (!) 135/93   Pulse 69   Temp (!) 97.5 F (36.4 C) (Oral)   Resp 20   SpO2 97%  Exam:  General: alert, oriented x3. No acute distress. Caucasian male resting comfortably in bed Eyes: eomi, perrla. Some horizontal nystagmus noted when patient gazing to left, less noticeable when gazing right ENTM: full thickness laceration of left nostril with sutures intact. Slight bruising on forehead. Moist mucous membranes, o/p clear  Neck: full range of motion, no cervical adenopathy   Cardiovascular: rrr, no m/r/g, palpable peripheral pulses bilaterally Respiratory: lungs clear to ausculation bilaterally, no chest pain, symmetric chest rise Gastrointestinal: soft, non-tender, non-distended. BS + MSK: 5/5 strength all muscle groups BLE, BUE. Derm: warm, dry Neuro: CN 2-12 intact, 5/5 strength BUE, BLE. No focal neuro deficits, normal sensation Psych: appropriate, pleasant  Labs and Imaging: CBC BMET  Recent Labs  Lab 02/19/17 1211  WBC 7.0  HGB 13.0  HCT 40.1  PLT 248   Recent Labs  Lab 02/19/17 1211  NA 138  K 4.7  CL 106  CO2 20*  BUN 12  CREATININE 0.95  GLUCOSE 158*   CALCIUM 9.3     Myrene BuddyJacob Fletcher, MD PGY-1 [facility]  I have seen and evaluated the above patient with the resident and agree with the documentation.  I have included my edits in blue.   Freddrick MarchAmin, Jesly Hartmann, MD 02/20/2017, 12:56 AM PGY-2

## 2017-02-19 NOTE — ED Notes (Signed)
Wife went home.

## 2017-02-19 NOTE — Patient Instructions (Addendum)
  Hemoglobin mildly low, but not much change from prior. Blood sugar ok. I did check other electrolytes, but the initial event is somewhat concerning as no specific reason found at this time. With persistent headache and the nasal laceration, further evaluation will be needed through the emergency room. [facility] to [facility] after leaving our office. If follow-up needed after emergency room visit, please follow-up with myself or Dr. Katrinka BlazingSmith.  IF you received an x-ray today, you will receive an invoice from [facility]. Please contact [facility] at [phone] with questions or concerns regarding your invoice.   IF you received labwork today, you will receive an invoice from [facility]. Please contact [facility] at [phone] with questions or concerns regarding your invoice.   Our billing staff will not be able to assist you with questions regarding bills from these companies.  You will be contacted with the lab results as soon as they are available. The fastest way to get your results is to activate your My Chart account. Instructions are located on the last page of this paperwork. If you have not heard from us regarding the results in 2 weeks, please contact this office.

## 2017-02-19 NOTE — Progress Notes (Signed)
Subjective:    Patient ID: Gregory Petersen, male    DOB: 1955/12/31, 62 y.o.   MRN: [id number]  HPI [name] is a 62 y.o. male Presents today for: Chief Complaint  Patient presents with  . Dizziness    Orthostatics Completed  . Loss of Consciousness  . Laceration    Right side of nose  . Head Injury  . Headache   Seen acutely due to dizziness, loss of consciousness, wound on nose and head injury with headache.  Woke up at 5 am to use bathroom - acute onset of dizziness when urinating. LOC at some point between there and the kitchen (about 8 feet away).  Found by family after heard him fall waking up appeared pale, confused for 10-15 seconds. Tried to get him up initially, but unsteady and fell again and hit back of head - more pale and eyes appeared fixed.  Then woke up and he declined 911 - but no known neuro deficits, seemed a little disorientated but talking normally.  Had bleeding from nose and on floor.  Called 911, but then cancelled as he was waking up. Noted lac on R nose. Nosebleed stopped with pressure.  Knot on back of right head. Now with headache, light sensitivity. Pt does not remember anything from micturition until on kitchen floor. No recent dark stools. No vomiting. Headache feels a little worse than initially - behind eyes. No chest pain. No prior weakness. No tremor. No current confusion.  No hx of HTN known, no meds. BP130/82 at 08/2016 physical.   3 alcoholic drinks last night.   Felt fine yesterday - rode bike to the gym.  No blood thinners or any medicine usually, but did get aspirin 325mg  x1 at 6am.  Still feeling dizzy with closed eyes and leaning forward. Able to walk on own. Hx of vertigo and inner ear issue in springtime.   Physical August 2018 with Dr. Katrinka Blazing. Retinal hemorrhage May 2018. Noted new onset retinal artery occlusion. Referred for carotid Doppler, but it does not appear that was done. Borderline A1c of 5.7 at that time, mild hyperlipidemia. Normal  hemoglobin. Hx of solitary kidney - donated kidney in 2001. Normal creatinine in 08/2016.    Patient Active Problem List   Diagnosis Date Noted  . Hematuria 06/19/2011  . Hemorrhoids, internal, with bleeding & occasional prolapse 06/17/2011   Past Medical History:  Diagnosis Date  . Allergy   . Blood in stool   . Blood in urine   . Hemorrhoids   . Hemorrhoids, internal, with bleeding & occasional prolapse 06/17/2011  . MEDIAL MENISCUS TEAR, LEFT 12/22/2005   Qualifier: Diagnosis of  By: Mannie Stabile MD, JOSEPH    . Nasal congestion   . Rectal bleeding   . Rectal pain   . 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 HEMORRHOID SURGERY    . 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 TONSILLECTOMY     patient does not remember exact date - was in childhood   No Known Allergies Prior to Admission medications   Medication Sig Start Date End Date Taking? Authorizing Provider  cetirizine (ZYRTEC) 5 MG tablet Take 5 mg by mouth daily.    [provider]  EPINEPHrine 0.3 mg/0.3 mL IJ SOAJ injection Inject 0.3 mLs (0.3 mg total) into the muscle once. 09/07/15 09/07/15  Linna Hoff, MD  ketoconazole (NIZORAL) 2 %  cream APPLY TO AFFECTED AREA(S) DAILY Patient not taking: Reported on 02/19/2017 02/10/16   Ethelda Chick, MD   Social History   Socioeconomic History  . Marital status: Married    Spouse name: Not on file  . Number of children: 1  . Years of education: Not on file  . Highest education level: Not on file  Social Needs  . Financial resource strain: Not on file  . Food insecurity - worry: Not on file  . Food insecurity - inability: Not on file  . Transportation needs - medical: Not on file  . Transportation needs - non-medical: Not on file  Occupational History  . Not on file  Tobacco Use  . Smoking status: Former Smoker    Last attempt to quit: 06/16/1989    Years since quitting: 27.6  . Smokeless tobacco:  Never Used  Substance and Sexual Activity  . Alcohol use: Yes    Comment: occasional  . Drug use: No  . Sexual activity: Yes    Birth control/protection: None  Other Topics Concern  . Not on file  Social History Narrative   Marital status: married x 1985      Children: 1 daughter (59), 2 grandchildren      Lives: with wife      Employment: Pension scheme manager; worked in [employer] x 22 years      Tobacco: none      Alcohol:  5-10 drinks per week. Craft beers      Exercise: gym 3-4 times per week; rides a bike daily.  Rock climbing.  [employer].     Review of Systems     Objective:   Physical Exam  Constitutional: He is oriented to person, place, and time. He appears well-developed and well-nourished.  HENT:  Head: Normocephalic and atraumatic.    Nose: Nose lacerations present.    Eyes: EOM are normal. Pupils are equal, round, and reactive to light.  Neck: No JVD present. Carotid bruit is not present.  Cardiovascular: Normal rate, regular rhythm and normal heart sounds.  No murmur heard. Pulmonary/Chest: Effort normal and breath sounds normal. He has no rales.  Musculoskeletal: He exhibits no edema.  Neurological: He is alert and oriented to person, place, and time. He has normal strength. He displays no tremor. No cranial nerve deficit. He displays no seizure activity. Coordination and gait normal. GCS eye subscore is 4. GCS verbal subscore is 5. GCS motor subscore is 6.  No pronator drift.  Skin: Skin is warm and dry.  Psychiatric: He has a normal mood and affect.  Vitals reviewed.  No carotid bruit Vitals:   02/19/17 0849  BP: (!) 150/98  Pulse: 73  Resp: 18  Temp: 98.2 F (36.8 C)  TempSrc: Oral  SpO2: 98%  Weight: 175 lb (79.4 kg)  Height: 5' 10.75" (1.797 m)   Orthostatic VS for the past 24 hrs (Last 3 readings):  BP- Lying Pulse- Lying BP- Sitting Pulse- Sitting BP- Standing at 0 minutes Pulse- Standing at 0 minutes  02/19/17 0858 155/89 68  (!) 168/96 65 (!) 166/99 74    EKG: SR, rate 67, no apparent significant change from 08/2006.  Results for orders placed or performed in visit on 02/19/17  POCT CBC  Result Value Ref Range   WBC 6.7 4.6 - 10.2 K/uL   Lymph, poc 1.1 0.6 - 3.4   POC LYMPH PERCENT 16.3 10 - 50 %L   MID (cbc) 0.3 0 - 0.9  POC MID % 5.1 0 - 12 %M   POC Granulocyte 5.3 2 - 6.9   Granulocyte percent 78.6 37 - 80 %G   RBC 4.67 (A) 4.69 - 6.13 M/uL   Hemoglobin 12.8 (A) 14.1 - 18.1 g/dL   HCT, POC 81.139.6 (A) 91.443.5 - 53.7 %   MCV 82.7 80 - 97 fL   MCH, POC 27.5 27 - 31.2 pg   MCHC 33.2 31.8 - 35.4 g/dL   RDW, POC 78.215.7 %   Platelet Count, POC 268 142 - 424 K/uL   MPV 7.2 0 - 99.8 fL  POCT glucose (manual entry)  Result Value Ref Range   POC Glucose 105 (A) 70 - 99 mg/dl       Assessment & Plan:  Rema JasmineDavid W Balgobin is a 62 y.o. male Syncope and collapse - Plan: EKG 12-Lead, POCT CBC, POCT glucose (manual entry), Basic metabolic panel  Dizziness - Plan: EKG 12-Lead, Basic metabolic panel  Open wound of face, initial encounter  Abrasion of scalp, initial encounter  Contusion of head, unspecified part of head, initial encounter  Solitary kidney - Plan: Basic metabolic panel  Acute onset of weakness/dizziness and loss of consciousness after micturition with resultant laceration to R nose. Nonfocal neuro exam, CBC with borderline anemia but overall reassuring. Glucose overall reassuring, and does not appear to be orthostatic. BP actually running high. Unknown cause of syncope with resultant amnesia from micturition to fall in the kitchen. No known seizure activity, but was somewhat confused after initial event with secondary fall and posterior scalp contusion. Still with headache and some light sensitivity, no regular blood thinners but did take aspirin once this morning.  -Will send to ER to decide on neuroimaging versus further monitoring/workup of initial syncopal event.  - Nasal laceration likely will also  need to be repaired at ER. Patient with family member and will transport by private vehicle. Triage nurse advised.  - Can decide on carotid ultrasound previously ordered in the ER or outpatient if needed.  Over 40 minutes of care provided, greater 50% counseling, second M.D. evaluation.  No orders of the defined types were placed in this encounter.  Patient Instructions    Hemoglobin mildly low, but not much change form prior. Blood sugar ok. I will check other electrolytes. Dizziness may have been "vagal episode" after urinating, or possible vertigo as well.      IF you received an x-ray today, you will receive an invoice from [facility]. Please contact [facility] at [phone] with questions or concerns regarding your invoice.   IF you received labwork today, you will receive an invoice from [facility]. Please contact [facility] at [phone] with questions or concerns regarding your invoice.   Our billing staff will not be able to assist you with questions regarding bills from these companies.  You will be contacted with the lab results as soon as they are available. The fastest way to get your results is to activate your My Chart account. Instructions are located on the last page of this paperwork. If you have not heard from us regarding the results in 2 weeks, please contact this office.     Signed,   Meredith StaggersJeffrey Bernese Doffing, MD [facility].  02/19/17 11:11 AM
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
## 2017-02-20 ENCOUNTER — Other Ambulatory Visit: Payer: Self-pay

## 2017-02-20 ENCOUNTER — Encounter (HOSPITAL_COMMUNITY): Payer: Self-pay | Admitting: *Deleted

## 2017-02-20 ENCOUNTER — Inpatient Hospital Stay (HOSPITAL_COMMUNITY): Payer: BLUE CROSS/BLUE SHIELD

## 2017-02-20 DIAGNOSIS — R55 Syncope and collapse: Secondary | ICD-10-CM

## 2017-02-20 DIAGNOSIS — I340 Nonrheumatic mitral (valve) insufficiency: Secondary | ICD-10-CM

## 2017-02-20 DIAGNOSIS — Z905 Acquired absence of kidney: Secondary | ICD-10-CM

## 2017-02-20 LAB — TROPONIN I
Troponin I: <0.03 ng/mL (ref <0.03)
Troponin I: <0.03 ng/mL (ref <0.03)

## 2017-02-20 LAB — ECHOCARDIOGRAM COMPLETE
HEIGHTINCHES: 70 in
WEIGHTICAEL: 2712 [oz_av]

## 2017-02-20 LAB — BASIC METABOLIC PANEL
ANION GAP: 11 (ref 5–15)
BUN: 11 mg/dL (ref 6–20)
CALCIUM: 8.7 mg/dL — AB (ref 8.9–10.3)
CO2: 19 mmol/L — ABNORMAL LOW (ref 22–32)
Chloride: 108 mmol/L (ref 101–111)
Creatinine, Ser: 1.06 mg/dL (ref 0.61–1.24)
GLUCOSE: 94 mg/dL (ref 65–99)
POTASSIUM: 4.3 mmol/L (ref 3.5–5.1)
Sodium: 138 mmol/L (ref 135–145)

## 2017-02-20 LAB — HEMOGLOBIN A1C
Hgb A1c MFr Bld: 6 % — ABNORMAL HIGH (ref 4.8–5.6)
MEAN PLASMA GLUCOSE: 125.5 mg/dL

## 2017-02-20 LAB — CBC
HEMATOCRIT: 38.6 % — AB (ref 39.0–52.0)
HEMOGLOBIN: 12.6 g/dL — AB (ref 13.0–17.0)
MCH: 27.3 pg (ref 26.0–34.0)
MCHC: 32.6 g/dL (ref 30.0–36.0)
MCV: 83.5 fL (ref 78.0–100.0)
Platelets: 253 10*3/uL (ref 150–400)
RBC: 4.62 MIL/uL (ref 4.22–5.81)
RDW: 15.3 % (ref 11.5–15.5)
WBC: 8 10*3/uL (ref 4.0–10.5)

## 2017-02-20 MED ORDER — MELATONIN 5 MG PO TABS
5.0000 mg | ORAL_TABLET | Freq: Every day | ORAL | Status: DC
  Filled 2017-02-20: qty 1

## 2017-02-20 MED ORDER — MELATONIN 3 MG PO TABS
3.0000 mg | ORAL_TABLET | Freq: Every day | ORAL | Status: DC
  Filled 2017-02-20: qty 1

## 2017-02-20 MED ORDER — DOCUSATE SODIUM 100 MG PO CAPS
100.0000 mg | ORAL_CAPSULE | Freq: Two times a day (BID) | ORAL | Status: DC
  Filled 2017-02-20: qty 1

## 2017-02-20 MED ORDER — ONDANSETRON HCL 4 MG PO TABS: 4.0000 mg | ORAL_TABLET | Freq: Four times a day (QID) | ORAL | Status: DC | PRN

## 2017-02-20 MED ORDER — GADOBENATE DIMEGLUMINE 529 MG/ML IV SOLN
15.0000 mL | Freq: Once | INTRAVENOUS | Status: AC | PRN
  Administered 2017-02-20: 15 mL via INTRAVENOUS

## 2017-02-20 MED ORDER — ACETAMINOPHEN 325 MG PO TABS: 650.0000 mg | ORAL_TABLET | Freq: Four times a day (QID) | ORAL | Status: DC | PRN

## 2017-02-20 MED ORDER — ENOXAPARIN SODIUM 40 MG/0.4ML ~~LOC~~ SOLN
40.0000 mg | SUBCUTANEOUS | Status: DC
  Administered 2017-02-20: 40 mg via SUBCUTANEOUS
  Filled 2017-02-20: qty 0.4

## 2017-02-20 MED ORDER — ONDANSETRON HCL 4 MG/2ML IJ SOLN: 4.0000 mg | Freq: Four times a day (QID) | INTRAMUSCULAR | Status: DC | PRN

## 2017-02-20 MED ORDER — ENOXAPARIN SODIUM 40 MG/0.4ML ~~LOC~~ SOLN: 40.0000 mg | SUBCUTANEOUS | Status: DC

## 2017-02-20 MED ORDER — ACETAMINOPHEN 650 MG RE SUPP: 650.0000 mg | Freq: Four times a day (QID) | RECTAL | Status: DC | PRN

## 2017-02-20 NOTE — Progress Notes (Signed)
Patient was NSR this morning and currently.  CCMD called this afternoon because patient had rhythm that looked like pacer spikes and patient does not have a pacer.  CCMD asked if patient had any equipment that could be interfering with heart monitor.  Upon assessment of patient and his environment, he had an ipad turned on and next to him in bed.  When ipad removed from bed, his heart rhythm changed to NSR.  Patient informed of this and asked him to keep ipad at a distance.

## 2017-02-20 NOTE — Progress Notes (Signed)
Admitted patient to room 3E30 from ED via wheelchair, patient alert and oriented, denied pain at this time, oriented to room, call bell placed within reach, placed patient on low bed.

## 2017-02-20 NOTE — Plan of Care (Signed)
  Completed/Met Education: Knowledge of General Education information will improve 02/20/2017 0930 - Completed/Met by Alonna Buckler, RN Coping: Level of anxiety will decrease 02/20/2017 0930 - Completed/Met by Alonna Buckler, RN Elimination: Will not experience complications related to bowel motility 02/20/2017 0930 - Completed/Met by Alonna Buckler, RN Will not experience complications related to urinary retention 02/20/2017 0930 - Completed/Met by Alonna Buckler, RN
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
## 2017-02-20 NOTE — Progress Notes (Signed)
VASCULAR LAB PRELIMINARY  PRELIMINARY  PRELIMINARY  PRELIMINARY  Carotid duplex completed.    Preliminary report:  1-39% ICA plaquing. Vertebral artery flow is antegrade.   Argie Applegate, RVT 02/20/2017, 10:03 AM

## 2017-02-20 NOTE — Plan of Care (Signed)
  Completed/Met Education: Knowledge of General Education information will improve 02/20/2017 0930 - Completed/Met by Alonna Buckler, RN Nutrition: Adequate nutrition will be maintained 02/20/2017 1417 - Completed/Met by Alonna Buckler, RN Coping: Level of anxiety will decrease 02/20/2017 0930 - Completed/Met by Alonna Buckler, RN Elimination: Will not experience complications related to bowel motility 02/20/2017 0930 - Completed/Met by Alonna Buckler, RN Will not experience complications related to urinary retention 02/20/2017 0930 - Completed/Met by Alonna Buckler, RN Pain Managment: General experience of comfort will improve 02/20/2017 1417 - Completed/Met by Alonna Buckler, RN Safety: Ability to remain free from injury will improve 02/20/2017 1417 - Completed/Met by Alonna Buckler, RN Skin Integrity: Risk for impaired skin integrity will decrease 02/20/2017 1417 - Completed/Met by Alonna Buckler, RN

## 2017-02-20 NOTE — Progress Notes (Signed)
  Echocardiogram 2D Echocardiogram has been performed.  Gregory Petersen L Androw 02/20/2017, 10:46 AM

## 2017-02-20 NOTE — Progress Notes (Signed)
Patient transported to vascular lab for  Carotid dopplers.     2D-Echo to be performed at bedside today per report from vascular. Patient updated on plan of care.

## 2017-02-20 NOTE — ED Notes (Signed)
Delay in lab draw,  Pt enroute to bathroom. 

## 2017-02-21 ENCOUNTER — Other Ambulatory Visit: Payer: Self-pay | Admitting: Student

## 2017-02-21 ENCOUNTER — Other Ambulatory Visit: Payer: Self-pay

## 2017-02-21 DIAGNOSIS — Z905 Acquired absence of kidney: Secondary | ICD-10-CM

## 2017-02-21 DIAGNOSIS — R55 Syncope and collapse: Secondary | ICD-10-CM

## 2017-02-21 NOTE — Plan of Care (Signed)
  Clinical Measurements: Ability to maintain clinical measurements within normal limits will improve 02/21/2017 0200 - Adequate for Discharge by Jeanella Flatteryhomas, Reynald Woods T, RN   Health Behavior/Discharge Planning: Ability to manage health-related needs will improve 02/21/2017 0200 - Adequate for Discharge by Jeanella Flatteryhomas, Quianna Avery T, RN

## 2017-02-21 NOTE — Progress Notes (Signed)
Family Medicine Teaching Service Daily Progress Note Intern Pager: [phone]  Patient name: Gregory Petersen Medical record number: [id number] Date of birth: 01/05/1956 Age: 62 y.o. Gender: male  Primary Care Provider: Ethelda ChickSmith, Kristi M, MD Consultants: none Code Status: FULL  Pt Overview and Major Events to Date:  Admit 1/25 after syncopal event  Assessment and Plan: Gregory Petersen is a 62 y.o. male presenting with a syncopal episode and fall.    Syncope- possibly vasovagal, concerning given no prodrome. Syncopal work up negative.  -outpatient loop recorder per cardiology recommendations - tylenol prn for pain, headache - cardiac monitoring - continuous pulse ox - vital signs per floor routine  Non-specific T-wave abnormality -outpatient loop -could consider cards c/s for new finding if syncopal workup shows   Amaurosis fugax- No findings of occlusion on imaging.  FEN/GI: heart healthy Prophylaxis: lovenox  Disposition: home today  Subjective:  Feeling well this morning. No further syncopal events. Tolerating diet, walking in hall without issue. He is eager to go home but worried this will happen again. He is an avid bike rider and he is worried to ride bike alone in case he has another event. Denies CP, palpitations, vision changes.  Objective: Temp:  [97.6 F (36.4 C)-98.8 F (37.1 C)] 97.7 F (36.5 C) (01/27 0318) Pulse Rate:  [54-66] 54 (01/27 0318) Resp:  [18-20] 18 (01/27 0318) BP: (134-146)/(86-96) 142/91 (01/27 0318) SpO2:  [98 %] 98 % (01/27 0318) Weight:  [169 lb 8 oz (76.9 kg)] 169 lb 8 oz (76.9 kg) (01/27 0318)   Physical Exam: General: pleasant gentleman sitting up in chair in NAD Cardiovascular: rrr, no m/r/g Respiratory: lungs clear to ausculation bilaterally Gastrointestinal: soft, non-tender, non-distended. BS + MSK: warm, well perfused, no edema or cyanosis Neuro: no focal deficits  Laboratory: Recent Labs  Lab 02/19/17 1001 02/19/17 1211  02/20/17 0438  WBC 6.7 7.0 8.0  HGB 12.8* 13.0 12.6*  HCT 39.6* 40.1 38.6*  PLT  --  248 253   Recent Labs  Lab 02/19/17 1143 02/19/17 1211 02/20/17 0438  NA 140 138 138  K 4.6 4.7 4.3  CL 103 106 108  CO2 19* 20* 19*  BUN 12 12 11   CREATININE 0.92 0.95 1.06  CALCIUM 9.5 9.3 8.7*  GLUCOSE 109* 158* 94    Imaging/Diagnostic Tests: Mr Maxine GlennMra Neck W Wo Contrast  Result Date: 02/20/2017 CLINICAL DATA:  Syncope while urinating yesterday morning. EXAM: MRA NECK WITHOUT AND WITH CONTRAST TECHNIQUE: Multiplanar and multiecho pulse sequences of the neck were obtained without and with intravenous contrast. Angiographic images of the neck were obtained using MRA technique without and with intravenous contrast. CONTRAST:  15mL MULTIHANCE GADOBENATE DIMEGLUMINE 529 MG/ML IV SOLN COMPARISON:  None. FINDINGS: The arch has a normal diameter.  Three vessel branching. Time-of-flight imaging shows antegrade flow in the bilateral carotid and vertebral circulation. Both carotid and vertebral arteries are smooth and diffusely patent, no evidence of dissection, atherosclerosis, or stenosis. Negative visualized intracranial vessels. IMPRESSION: Normal neck MRA.  No findings for vertebrobasilar insufficiency. Electronically Signed   By: Marnee SpringJonathon  Watts M.D.   On: 02/20/2017 19:58    Tillman SersRiccio, Henchy Mccauley C, DO 02/21/2017, 8:09 AM PGY-2, [facility] FPTS Intern pager: [phone], text pages welcome

## 2017-02-21 NOTE — Progress Notes (Signed)
Normal echo. Per Dr Norris Crossurner's consult note we will arrange 30 day outpatient monitor. No further inpatient cariology recs at this time. We will sign off.   Dominga FerryJ Vista Sawatzky MD

## 2017-02-21 NOTE — Progress Notes (Signed)
Pt discharge instructions reviewed with pt. Pt verbalizes understanding. Pt belongings with pt. Pt is not in distress. Pt refuses wheelchair.
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
## 2017-02-21 NOTE — Discharge Instructions (Signed)
Syncope Syncope is when you temporarily lose consciousness. Syncope may also be called fainting or passing out. It is caused by a sudden decrease in blood flow to the brain. Even though most causes of syncope are not dangerous, syncope can be a sign of a serious medical problem. Signs that you may be about to faint include:  Feeling dizzy or light-headed.  Feeling nauseous.  Seeing all white or all black in your field of vision.  Having cold, clammy skin.  If you fainted, get medical help right away.Call your local emergency services (911 in the [place] Do not drive yourself to the hospital. Follow these instructions at home: Pay attention to any changes in your symptoms. Take these actions to help with your condition:  Have someone stay with you until you feel stable.  Do not drive, use machinery, or play sports until your health care provider says it is okay.  Keep all follow-up visits as told by your health care provider. This is important.  If you start to feel like you might faint, lie down right away and raise (elevate) your feet above the level of your heart. Breathe deeply and steadily. Wait until all of the symptoms have passed.  Drink enough fluid to keep your urine clear or pale yellow.  If you are taking blood pressure or heart medicine, get up slowly and take several minutes to sit and then stand. This can reduce dizziness.  Take over-the-counter and prescription medicines only as told by your health care provider.  Get help right away if:  You have a severe headache.  You have unusual pain in your chest, abdomen, or back.  You are bleeding from your mouth or rectum, or you have black or tarry stool.  You have a very fast or irregular heartbeat (palpitations).  You have pain with breathing.  You faint once or repeatedly.  You have a seizure.  You are confused.  You have trouble walking.  You have severe weakness.  You have vision problems. These  symptoms may represent a serious problem that is an emergency. Do not wait to see if your symptoms will go away. Get medical help right away. Call your local emergency services (911 in the [place] Do not drive yourself to the hospital. This information is not intended to replace advice given to you by your health care provider. Make sure you discuss any questions you have with your health care provider. Document Released: 01/12/2005 Document Revised: 06/20/2015 Document Reviewed: 09/26/2014 Elsevier Interactive Patient Education  2018 [employer]vier Inc.  

## 2017-02-21 NOTE — Progress Notes (Signed)
No concerns or complaints from patient throughout the night, patient ambulates well with now instances of dizziness or syncope.

## 2017-02-22 ENCOUNTER — Ambulatory Visit: Payer: BLUE CROSS/BLUE SHIELD | Admitting: Physician Assistant

## 2017-02-22 ENCOUNTER — Telehealth: Payer: Self-pay

## 2017-02-23 NOTE — Telephone Encounter (Signed)
Attempted to call patient 3 times to complete TOC call and schedule visit. Left message each time.

## 2017-02-24 ENCOUNTER — Encounter: Payer: Self-pay | Admitting: Physician Assistant

## 2017-02-24 ENCOUNTER — Ambulatory Visit (INDEPENDENT_AMBULATORY_CARE_PROVIDER_SITE_OTHER): Payer: BLUE CROSS/BLUE SHIELD

## 2017-02-24 ENCOUNTER — Ambulatory Visit: Payer: BLUE CROSS/BLUE SHIELD | Admitting: Physician Assistant

## 2017-02-24 VITALS — BP 140/90 | HR 70 | Temp 97.9°F | Resp 18 | Ht 70.0 in | Wt 174.0 lb

## 2017-02-24 DIAGNOSIS — Z4802 Encounter for removal of sutures: Secondary | ICD-10-CM

## 2017-02-24 DIAGNOSIS — S0180XD Unspecified open wound of other part of head, subsequent encounter: Secondary | ICD-10-CM

## 2017-02-24 DIAGNOSIS — R55 Syncope and collapse: Secondary | ICD-10-CM

## 2017-02-24 NOTE — Telephone Encounter (Signed)
Appointment with me 03/01/17; please continue to call.

## 2017-02-24 NOTE — Telephone Encounter (Signed)
Left message on voicemail to return call to complete TOC review.

## 2017-02-24 NOTE — Patient Instructions (Signed)
     IF you received an x-ray today, you will receive an invoice from [facility]. Please contact [facility] at [phone] with questions or concerns regarding your invoice.   IF you received labwork today, you will receive an invoice from [facility]. Please contact [facility] at [phone] with questions or concerns regarding your invoice.   Our billing staff will not be able to assist you with questions regarding bills from these companies.  You will be contacted with the lab results as soon as they are available. The fastest way to get your results is to activate your My Chart account. Instructions are located on the last page of this paperwork. If you have not heard from us regarding the results in 2 weeks, please contact this office.     

## 2017-02-24 NOTE — Progress Notes (Signed)
PRIMARY CARE AT [facility] [address], [place] [address] [phone]  Date:  02/24/2017   Name:  Gregory Petersen   DOB:  [birth year]   MRN:  [id number]  PCP:  Ethelda Chick, MD    History of Present Illness:  Gregory Petersen is a 62 y.o. male patient who presents to PCP with  Chief Complaint  Patient presents with  . Suture / Staple Removal    nose     Patient is here today for suture removal. He also understands that he is here for the hospitalization follow up as well.  He notes that he has not had a syncopal event since the onset and report.  He has had no signs of the symptoms. 3 sutures external nose +1chrome gut  He was seen here 5 days ago for loc and syncopal event with a nose laceration.  Admitted for 2 days, before release.   Following discharge.. Recommended heart monitor, and cardiology consult is scheduled.   EKG had mild prolonged QT  telemetry and found to have mildly prolonged QT interval. Patient had a negative CT head. EKG with NSR, nonspecific t-wave abnormality and prolonged QTc  Ct Cervical Spine Wo Contrast  Result Date: 02/19/2017 CLINICAL DATA:  Head and neck trauma secondary to 2 falls this morning. EXAM: CT CERVICAL SPINE WITHOUT CONTRAST TECHNIQUE: Multidetector CT imaging of the cervical spine was performed without intravenous contrast. Multiplanar CT image reconstructions were also generated. COMPARISON:  None. FINDINGS: Alignment: Slight reversal of the normal thoracic lordosis. Skull base and vertebrae: No acute fracture. No primary bone lesion or focal pathologic process. Soft tissues and spinal canal: No prevertebral fluid or swelling. No visible canal hematoma. Disc levels:  C2-3: Negative. C3-4: Minimal disc osteophyte complex to the left with no neural impingement. C4-5: Tiny broad-based disc osteophyte complex with no neural impingement. C5-6: Slight disc space narrowing degenerative changes of the vertebral endplates with a broad-based small disc  osteophyte complex without neural impingement. Moderate narrowing of the neural foramina bilaterally. C6-7: Small broad-based disc osteophyte complex without neural impingement. Widely patent neural foramina. C7-T1: Negative. Upper chest: Negative. Other: None IMPRESSION: No acute abnormality. 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 Electronically Signed   By: Francene Boyers M.D.   On: 02/19/2017 13:24   Mr Maxine Glenn Neck W Wo Contrast  Result Date: 02/20/2017 CLINICAL DATA:  Syncope while urinating yesterday morning. EXAM: MRA NECK WITHOUT AND WITH CONTRAST TECHNIQUE: Multiplanar and multiecho pulse sequences of the neck were obtained without and with intravenous contrast. Angiographic images of the neck were obtained using MRA technique without and with intravenous contrast. CONTRAST:  15mL MULTIHANCE GADOBENATE DIMEGLUMINE 529 MG/ML IV SOLN COMPARISON:  None. FINDINGS: The arch has a normal diameter.  Three vessel branching. Time-of-flight imaging shows antegrade flow in the bilateral carotid and vertebral circulation. Both carotid and vertebral arteries are smooth and diffusely patent, no evidence of dissection, atherosclerosis, or stenosis. Negative visualized intracranial vessels. IMPRESSION: Normal neck MRA.  No findings for vertebrobasilar insufficiency. Electronically Signed   By: Marnee Spring M.D.   On: 02/20/2017 19:58   Echo 1/26: LV EF: 55% - 60%  Ct Cervical Spine Wo Contrast  Result Date: 02/19/2017 CLINICAL DATA:  Head and neck trauma secondary to 2 falls this morning. EXAM: CT CERVICAL SPINE WITHOUT CONTRAST TECHNIQUE: Multidetector CT imaging of the cervical spine was performed without intravenous contrast. Multiplanar CT image reconstructions were also generated. COMPARISON:  None. FINDINGS: Alignment: Slight  reversal of the normal thoracic lordosis. Skull base and vertebrae: No acute fracture. No primary bone lesion or focal pathologic process. Soft  tissues and spinal canal: No prevertebral fluid or swelling. No visible canal hematoma. Disc levels:  C2-3: Negative. C3-4: Minimal disc osteophyte complex to the left with no neural impingement. C4-5: Tiny broad-based disc osteophyte complex with no neural impingement. C5-6: Slight disc space narrowing degenerative changes of the vertebral endplates with a broad-based small disc osteophyte complex without neural impingement. Moderate narrowing of the neural foramina bilaterally. C6-7: Small broad-based disc osteophyte complex without neural impingement. Widely patent neural foramina. C7-T1: Negative. Upper chest: Negative. Other: None IMPRESSION: No acute abnormality. 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 Electronically Signed   By: Francene BoyersJames  Maxwell M.D.   On: 02/19/2017 13:24   Mr Maxine GlennMra Neck W Wo Contrast  Result Date: 02/20/2017 CLINICAL DATA:  Syncope while urinating yesterday morning. EXAM: MRA NECK WITHOUT AND WITH CONTRAST TECHNIQUE: Multiplanar and multiecho pulse sequences of the neck were obtained without and with intravenous contrast. Angiographic images of the neck were obtained using MRA technique without and with intravenous contrast. CONTRAST:  15mL MULTIHANCE GADOBENATE DIMEGLUMINE 529 MG/ML IV SOLN COMPARISON:  None. FINDINGS: The arch has a normal diameter.  Three vessel branching. Time-of-flight imaging shows antegrade flow in the bilateral carotid and vertebral circulation. Both carotid and vertebral arteries are smooth and diffusely patent, no evidence of dissection, atherosclerosis, or stenosis. Negative visualized intracranial vessels. IMPRESSION: Normal neck MRA.  No findings for vertebrobasilar insufficiency. Electronically Signed   By: Marnee SpringJonathon  Watts M.D.   On: 02/20/2017 19:58   Echo 1/26:  LV EF: 55% - 60%   US carotid 1/26:    Final Interpretation: Right Carotid: The extracranial vessels were near-normal with only minimal wall thickening or  plaque.  Left Carotid: The extracranial vessels were near-normal with only minimal wall thickening or plaque.  Vertebrals: Both vertebral arteries were patent with antegrade flow. Subclavians: Normal flow hemodynamics were seen in bilateral subclavian arteries.  Patient Active Problem List   Diagnosis Date Noted  . History of nephrectomy   . Syncope 02/19/2017  . Laceration of nose   . Vision abnormalities   . Hematuria 06/19/2011  . Hemorrhoids, internal, with bleeding & occasional prolapse 06/17/2011    Past Medical History:  Diagnosis Date  . Allergy   . Blood in stool   . Blood in urine   . Hemorrhoids   . Hemorrhoids, internal, with bleeding & occasional prolapse 06/17/2011  . MEDIAL MENISCUS TEAR, LEFT 12/22/2005   Qualifier: Diagnosis of  By: Mannie StabilePYE MD, JOSEPH    . Nasal congestion   . Rectal bleeding   . Rectal pain   . 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 HEMORRHOID SURGERY    . 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 TONSILLECTOMY     patient does not remember exact date - was in childhood    Social History   Tobacco Use  . Smoking status: Former Smoker    Last attempt to quit: 06/16/1989    Years since quitting: 27.7  . Smokeless tobacco: Never Used  Substance Use Topics  . Alcohol use: Yes    Comment: occasional  . Drug use: No    Family History  Problem Relation Age of Onset  . Heart disease Mother   . Hypertension Mother   .  Heart disease Father 24       AMI as cause of death  . Diabetes Father   . COPD Brother   . Tuberous sclerosis Brother     No Known Allergies  Medication list has been reviewed and updated.  Current Outpatient Medications on File Prior to Visit  Medication Sig Dispense Refill  . Melatonin 5 MG TABS Take 5 mg by mouth at bedtime.    . naproxen sodium (ALEVE) 220 MG tablet Take 440 mg by mouth as needed (pain).    Marland Kitchen EPINEPHrine 0.3 mg/0.3 mL IJ  SOAJ injection Inject 0.3 mLs (0.3 mg total) into the muscle once. 1 Device 1   No current facility-administered medications on file prior to visit.     ROS ROS otherwise unremarkable unless listed above.  Physical Examination: BP 140/90   Pulse 70   Temp 97.9 F (36.6 C) (Oral)   Resp 18   Ht 5\' 10"  (1.778 m)   Wt 174 lb (78.9 kg)   SpO2 97%   BMI 24.97 kg/m  Ideal Body Weight: Weight in (lb) to have BMI = 25: 173.9  Physical Exam  Constitutional: He is oriented to person, place, and time. He appears well-developed and well-nourished. No distress.  HENT:  Head: Normocephalic and atraumatic.  Eyes: Conjunctivae and EOM are normal. Pupils are equal, round, and reactive to light.  Cardiovascular: Normal rate.  No murmur heard. Pulmonary/Chest: Effort normal. No respiratory distress.  Neurological: He is alert and oriented to person, place, and time.  Skin: Skin is warm and dry. He is not diaphoretic.  Sutures in place at nose with laceration without dehiscence.  No erythema.  Sanguinous dryness at the right nostril consistent with laceration.    Psychiatric: He has a normal mood and affect. His behavior is normal.   CMP Latest Ref Rng & Units 02/20/2017 02/19/2017 02/19/2017  Glucose 65 - 99 mg/dL 94 161(W) 960(A)  BUN 6 - 20 mg/dL 11 12 12   Creatinine 0.61 - 1.24 mg/dL 5.40 9.81 1.91  Sodium 135 - 145 mmol/L 138 138 140  Potassium 3.5 - 5.1 mmol/L 4.3 4.7 4.6  Chloride 101 - 111 mmol/L 108 106 103  CO2 22 - 32 mmol/L 19(L) 20(L) 19(L)  Calcium 8.9 - 10.3 mg/dL 4.7(W) 9.3 9.5  Total Protein 6.0 - 8.5 g/dL - - -  Total Bilirubin 0.0 - 1.2 mg/dL - - -  Alkaline Phos 39 - 117 IU/L - - -  AST 0 - 40 IU/L - - -  ALT 0 - 44 IU/L - - -   CBC Latest Ref Rng & Units 02/20/2017 02/19/2017 02/19/2017  WBC 4.0 - 10.5 K/uL 8.0 7.0 6.7  Hemoglobin 13.0 - 17.0 g/dL 12.6(L) 13.0 12.8(A)  Hematocrit 39.0 - 52.0 % 38.6(L) 40.1 39.6(A)  Platelets 150 - 400 K/uL 253 248 -    Assessment  and Plan: Gregory Petersen is a 62 y.o. male who is here today for cc of  Chief Complaint  Patient presents with  . Suture / Staple Removal    nose   Patient will follow up with cardiology today at 2pm.  Likely receiving monitor. Await results of cardiology at this time. Labs not necessary to repeat today.   Sutures removed without complication.  Wound care discussed.  Syncope and collapse  Visit for suture removal  Open wound of face, subsequent encounter  Trena Platt, PA-C [facility] 1/30/20191:34 PM

## 2017-03-01 ENCOUNTER — Ambulatory Visit: Payer: BLUE CROSS/BLUE SHIELD | Admitting: Family Medicine

## 2017-03-01 ENCOUNTER — Encounter: Payer: Self-pay | Admitting: Family Medicine

## 2017-03-01 VITALS — BP 122/82 | HR 109 | Temp 98.0°F | Resp 16 | Ht 71.65 in | Wt 173.0 lb

## 2017-03-01 DIAGNOSIS — E7439 Other disorders of intestinal carbohydrate absorption: Secondary | ICD-10-CM | POA: Diagnosis not present

## 2017-03-01 DIAGNOSIS — R55 Syncope and collapse: Secondary | ICD-10-CM | POA: Diagnosis not present

## 2017-03-01 DIAGNOSIS — F0781 Postconcussional syndrome: Secondary | ICD-10-CM | POA: Diagnosis not present

## 2017-03-01 DIAGNOSIS — S0121XD Laceration without foreign body of nose, subsequent encounter: Secondary | ICD-10-CM
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
## 2017-03-01 NOTE — Patient Instructions (Addendum)
     IF you received an x-ray today, you will receive an invoice from [facility]. Please contact [facility] at [phone] with questions or concerns regarding your invoice.   IF you received labwork today, you will receive an invoice from [facility]. Please contact [facility] at [phone] with questions or concerns regarding your invoice.   Our billing staff will not be able to assist you with questions regarding bills from these companies.  You will be contacted with the lab results as soon as they are available. The fastest way to get your results is to activate your My Chart account. Instructions are located on the last page of this paperwork. If you have not heard from us regarding the results in 2 weeks, please contact this office.      Syncope Syncope is when you temporarily lose consciousness. Syncope may also be called fainting or passing out. It is caused by a sudden decrease in blood flow to the brain. Even though most causes of syncope are not dangerous, syncope can be a sign of a serious medical problem. Signs that you may be about to faint include:  Feeling dizzy or light-headed.  Feeling nauseous.  Seeing all white or all black in your field of vision.  Having cold, clammy skin.  If you fainted, get medical help right away.Call your local emergency services (911 in the [place] Do not drive yourself to the hospital. Follow these instructions at home: Pay attention to any changes in your symptoms. Take these actions to help with your condition:  Have someone stay with you until you feel stable.  Do not drive, use machinery, or play sports until your health care provider says it is okay.  Keep all follow-up visits as told by your health care provider. This is important.  If you start to feel like you might faint, lie down right away and raise (elevate) your feet above the level of your heart. Breathe deeply and steadily. Wait until all of the symptoms  have passed.  Drink enough fluid to keep your urine clear or pale yellow.  If you are taking blood pressure or heart medicine, get up slowly and take several minutes to sit and then stand. This can reduce dizziness.  Take over-the-counter and prescription medicines only as told by your health care provider.  Get help right away if:  You have a severe headache.  You have unusual pain in your chest, abdomen, or back.  You are bleeding from your mouth or rectum, or you have black or tarry stool.  You have a very fast or irregular heartbeat (palpitations).  You have pain with breathing.  You faint once or repeatedly.  You have a seizure.  You are confused.  You have trouble walking.  You have severe weakness.  You have vision problems. These symptoms may represent a serious problem that is an emergency. Do not wait to see if your symptoms will go away. Get medical help right away. Call your local emergency services (911 in the [place] Do not drive yourself to the hospital. This information is not intended to replace advice given to you by your health care provider. Make sure you discuss any questions you have with your health care provider. Document Released: 01/12/2005 Document Revised: 06/20/2015 Document Reviewed: 09/26/2014 Elsevier Interactive Patient Education  [employer]2018 [employer]

## 2017-03-02 LAB — CBC WITH DIFFERENTIAL/PLATELET
BASOS ABS: 0.1 10*3/uL (ref 0.0–0.2)
BASOS: 2 %
EOS (ABSOLUTE): 0.1 10*3/uL (ref 0.0–0.4)
Eos: 2 %
Hematocrit: 38.7 % (ref 37.5–51.0)
Hemoglobin: 12.9 g/dL — ABNORMAL LOW (ref 13.0–17.7)
IMMATURE GRANS (ABS): 0 10*3/uL (ref 0.0–0.1)
Immature Granulocytes: 0 %
LYMPHS: 32 %
Lymphocytes Absolute: 1.4 10*3/uL (ref 0.7–3.1)
MCH: 27.2 pg (ref 26.6–33.0)
MCHC: 33.3 g/dL (ref 31.5–35.7)
MCV: 82 fL (ref 79–97)
MONOS ABS: 0.4 10*3/uL (ref 0.1–0.9)
Monocytes: 9 %
NEUTROS ABS: 2.4 10*3/uL (ref 1.4–7.0)
Neutrophils: 55 %
PLATELETS: 309 10*3/uL (ref 150–379)
RBC: 4.75 x10E6/uL (ref 4.14–5.80)
RDW: 16.1 % — AB (ref 12.3–15.4)
WBC: 4.4 10*3/uL (ref 3.4–10.8)

## 2017-03-02 LAB — COMPREHENSIVE METABOLIC PANEL
ALK PHOS: 95 IU/L (ref 39–117)
ALT: 33 IU/L (ref 0–44)
AST: 25 IU/L (ref 0–40)
Albumin/Globulin Ratio: 1.8 (ref 1.2–2.2)
Albumin: 4.6 g/dL (ref 3.6–4.8)
BILIRUBIN TOTAL: 0.3 mg/dL (ref 0.0–1.2)
BUN/Creatinine Ratio: 7 — ABNORMAL LOW (ref 10–24)
BUN: 8 mg/dL (ref 8–27)
CHLORIDE: 104 mmol/L (ref 96–106)
CO2: 19 mmol/L — ABNORMAL LOW (ref 20–29)
Calcium: 9.8 mg/dL (ref 8.6–10.2)
Creatinine, Ser: 1.07 mg/dL (ref 0.76–1.27)
GFR calc Af Amer: 87 mL/min/{1.73_m2} (ref >59)
GFR calc non Af Amer: 75 mL/min/{1.73_m2} (ref >59)
GLUCOSE: 83 mg/dL (ref 65–99)
Globulin, Total: 2.5 g/dL (ref 1.5–4.5)
Potassium: 4.8 mmol/L (ref 3.5–5.2)
Sodium: 139 mmol/L (ref 134–144)
TOTAL PROTEIN: 7.1 g/dL (ref 6.0–8.5)

## 2017-04-22 NOTE — Progress Notes (Deleted)
Cardiology Office Note:    Date:  04/22/2017   ID:  Gregory Petersen, DOB [birth year], MRN [id number]  PCP:  Ethelda Chick, MD  Cardiologist:  No primary care provider on file.    Referring MD: Ethelda Chick, MD   No chief complaint on file.   History of Present Illness:    YVAN DORITY is a 62 y.o. male with a hx of ***  Past Medical History:  Diagnosis Date  . Allergy   . Blood in stool   . Blood in urine   . Hemorrhoids   . Hemorrhoids, internal, with bleeding & occasional prolapse 06/17/2011  . MEDIAL MENISCUS TEAR, LEFT 12/22/2005   Qualifier: Diagnosis of  By: Mannie Stabile MD, JOSEPH    . Nasal congestion   . Rectal bleeding   . Rectal pain   . 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 HEMORRHOID SURGERY    . 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 TONSILLECTOMY     patient does not remember exact date - was in childhood    Current Medications: No outpatient medications have been marked as taking for the 04/23/17 encounter (Appointment) with Quintella Reichert, MD.     Allergies:   Patient has no known allergies.   Social History   Socioeconomic History  . Marital status: Married    Spouse name: Not on file  . Number of children: 1  . Years of education: Not on file  . Highest education level: Not on file  Occupational History  . Not on file  Social Needs  . Financial resource strain: Not on file  . Food insecurity:    Worry: Not on file    Inability: Not on file  . Transportation needs:    Medical: Not on file    Non-medical: Not on file  Tobacco Use  . Smoking status: Former Smoker    Last attempt to quit: 06/16/1989    Years since quitting: 27.8  . Smokeless tobacco: Never Used  Substance and Sexual Activity  . Alcohol use: Yes    Comment: occasional  . Drug use: No  . Sexual activity: Yes    Birth control/protection: None  Lifestyle  . Physical activity:    Days per week: Not on  file    Minutes per session: Not on file  . Stress: Not on file  Relationships  . Social connections:    Talks on phone: Not on file    Gets together: Not on file    Attends religious service: Not on file    Active member of club or organization: Not on file    Attends meetings of clubs or organizations: Not on file    Relationship status: Not on file  Other Topics Concern  . Not on file  Social History Narrative   Marital status: married x 1985      Children: 1 daughter (26), 2 grandchildren      Lives: with wife      Employment: Pension scheme manager; worked in [employer] x 22 years      Tobacco: none      Alcohol:  5-10 drinks per week. Craft beers      Exercise: gym 3-4 times per week; rides a bike daily.  Rock climbing.  [employer].      Family History: The patient's ***family history includes COPD in his brother;  Diabetes in his father; Heart disease in his mother; Heart disease (age of onset: 8665) in his father; Hypertension in his mother; Tuberous sclerosis in his brother.  ROS:   Please see the history of present illness.    ROS  All other systems reviewed and negative.   EKGs/Labs/Other Studies Reviewed:    The following studies were reviewed today: ***  EKG:  EKG is *** ordered today.  The ekg ordered today demonstrates ***  Recent Labs: 02/19/2017: TSH 0.907 03/01/2017: ALT 33; BUN 8; Creatinine, Ser 1.07; Hemoglobin 12.9; Platelets 309; Potassium 4.8; Sodium 139   Recent Lipid Panel    Component Value Date/Time   CHOL 197 02/19/2017 2145   CHOL 245 (H) 09/16/2016 1749   TRIG 114 02/19/2017 2145   HDL 49 02/19/2017 2145   HDL 61 09/16/2016 1749   CHOLHDL 4.0 02/19/2017 2145   VLDL 23 02/19/2017 2145   LDLCALC 125 (H) 02/19/2017 2145   LDLCALC 153 (H) 09/16/2016 1749    Physical Exam:    VS:  There were no vitals taken for this visit.    Wt Readings from Last 3 Encounters:  03/01/17 173 lb (78.5 kg)  02/24/17 174 lb (78.9 kg)  02/21/17 169  lb 8 oz (76.9 kg)     GEN: *** Well nourished, well developed in no acute distress HEENT: Normal NECK: No JVD; No carotid bruits LYMPHATICS: No lymphadenopathy CARDIAC: ***RRR, no murmurs, rubs, gallops RESPIRATORY:  Clear to auscultation without rales, wheezing or rhonchi  ABDOMEN: Soft, non-tender, non-distended MUSCULOSKELETAL:  No edema; No deformity  SKIN: Warm and dry NEUROLOGIC:  Alert and oriented x 3 PSYCHIATRIC:  Normal affect   ASSESSMENT:    No diagnosis found. PLAN:    In order of problems listed above:  ***   Medication Adjustments/Labs and Tests Ordered: Current medicines are reviewed at length with the patient today.  Concerns regarding medicines are outlined above.  No orders of the defined types were placed in this encounter.  No orders of the defined types were placed in this encounter.   Signed, Armanda Magicraci Turner, MD  04/22/2017 10:04 PM    [facility]

## 2017-04-23 ENCOUNTER — Encounter: Payer: Self-pay | Admitting: Cardiology

## 2017-04-23 ENCOUNTER — Encounter: Payer: BLUE CROSS/BLUE SHIELD | Admitting: Cardiology

## 2017-04-24 NOTE — Progress Notes (Signed)
This encounter was created in error - please disregard.

## 2017-05-03 ENCOUNTER — Ambulatory Visit (INDEPENDENT_AMBULATORY_CARE_PROVIDER_SITE_OTHER): Payer: BLUE CROSS/BLUE SHIELD | Admitting: Internal Medicine

## 2017-05-03 ENCOUNTER — Encounter: Payer: Self-pay | Admitting: Internal Medicine

## 2017-05-03 VITALS — BP 140/88 | HR 65 | Ht 70.0 in | Wt 175.0 lb

## 2017-05-03 DIAGNOSIS — R55 Syncope and collapse: Secondary | ICD-10-CM | POA: Diagnosis not present

## 2017-05-03 NOTE — Progress Notes (Signed)
Electrophysiology Office Note   Date:  05/03/2017   ID:  Gregory, Petersen 09-03-1955, MRN [id number]  PCP:  Ethelda Chick, MD  Cardiologist:  Dr Mayford Knife Primary Electrophysiologist: Hillis Range, MD    Chief Complaint  Patient presents with  . syncope and collapse     History of Present Illness: Gregory Petersen is a 62 y.o. male who presents today for electrophysiology evaluation.   He is referred by Dr Mayford Knife for Ep consultation regarding syncope.  He presented to [facility]/20/17 following a syncopal event.  He reports being active and in good health.  He woke up early in the morning and went to the bathroom.  He then had micturition syncope.  Of note, he reports having at least 4 beers the night before.  He was evaluated and his workup was unremarkable.  He has done well since without additional episodes.  He wore an event monitor which was normal. Today, he denies symptoms of palpitations, chest pain, shortness of breath, orthopnea, PND, lower extremity edema, claudication, dizziness, presyncope, syncope, bleeding, or neurologic sequela. The patient is tolerating medications without difficulties and is otherwise without complaint today.    Past Medical History:  Diagnosis Date  . Allergy   . Blood in stool   . Blood in urine   . Hemorrhoids   . Hemorrhoids, internal, with bleeding & occasional prolapse 06/17/2011  . MEDIAL MENISCUS TEAR, LEFT 12/22/2005   Qualifier: Diagnosis of  By: Mannie Stabile MD, JOSEPH    . Nasal congestion   . Rectal bleeding   . Rectal pain   . 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 HEMORRHOID SURGERY    . 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 TONSILLECTOMY     patient does not remember exact date - was in childhood     Current Outpatient Medications  Medication Sig Dispense Refill  . EPINEPHrine 0.3 mg/0.3 mL IJ SOAJ injection Inject 0.3 mLs (0.3 mg total) into the muscle once. 1  Device 1  . Melatonin 5 MG TABS Take 5 mg by mouth at bedtime.    . naproxen sodium (ALEVE) 220 MG tablet Take 440 mg by mouth as needed (pain).     No current facility-administered medications for this visit.     Allergies:   Patient has no known allergies.   Social History:  The patient  reports that he quit smoking about 27 years ago. He has never used smokeless tobacco. He reports that he drinks alcohol. He reports that he does not use drugs.   Family History:  The patient's  family history includes COPD in his brother; Diabetes in his father; Heart disease in his mother; Heart disease (age of onset: 71) in his father; Hypertension in his mother; Tuberous sclerosis in his brother.  Denies FH of sudden death, arrhythmias, or syncope  ROS:  Please see the history of present illness.   All other systems are personally reviewed and negative.    PHYSICAL EXAM: VS:  BP 140/88   Pulse 65   Ht 5\' 10"  (1.778 m)   Wt 175 lb (79.4 kg)   SpO2 98%   BMI 25.11 kg/m  , BMI Body mass index is 25.11 kg/m. GEN: Well nourished, well developed, in no acute distress  HEENT: normal  Neck: no JVD, carotid bruits, or masses Cardiac: RRR; no murmurs, rubs, or gallops,no edema  Respiratory:  clear to auscultation bilaterally, normal work of breathing GI: soft, nontender, nondistended, + BS MS: no deformity or atrophy  Skin: warm and dry  Neuro:  Strength and sensation are intact Psych: euthymic mood, full affect  EKG:  EKG is ordered today. The ekg ordered today is personally reviewed and shows sinus rhythm 64 bpm, PR 162 msec, QRS 92 msec, Qtc 470 msec   Recent Labs: 02/19/2017: TSH 0.907 03/01/2017: ALT 33; BUN 8; Creatinine, Ser 1.07; Hemoglobin 12.9; Platelets 309; Potassium 4.8; Sodium 139  personally reviewed   Lipid Panel     Component Value Date/Time   CHOL 197 02/19/2017 2145   CHOL 245 (H) 09/16/2016 1749   TRIG 114 02/19/2017 2145   HDL 49 02/19/2017 2145   HDL 61 09/16/2016  1749   CHOLHDL 4.0 02/19/2017 2145   VLDL 23 02/19/2017 2145   LDLCALC 125 (H) 02/19/2017 2145   LDLCALC 153 (H) 09/16/2016 1749   personally reviewed   Wt Readings from Last 3 Encounters:  05/03/17 175 lb (79.4 kg)  04/23/17 176 lb 9.6 oz (80.1 kg)  03/01/17 173 lb (78.5 kg)      Other studies personally reviewed: Additional studies/ records that were reviewed today include: echo 02/20/17  Review of the above records today demonstrates: EF 55%, normal RV size/function, mild MR,    ASSESSMENT AND PLAN:  1.  micturition syncope in the setting of ETOH the night before We discussed at length today.  ekg and echo are low risk.  No arrhythmias on 30 day monitor. I did offer implantable loop recorder today which he has declined.  He may be more willing to reconsider if episodes recur.  I think that this is reasonable.  ETOH advoidance is encouraged   Follow-up:  As needed  Current medicines are reviewed at length with the patient today.   The patient does not have concerns regarding his medicines.  The following changes were made today:  none    Signed, Hillis RangeJames Marenda Accardi, MD  05/03/2017 9:22 AM     [facility] [address] [place] [address] [phone] (office) [phone] (fax)

## 2017-05-03 NOTE — Patient Instructions (Addendum)
Medication Instructions:  Your physician recommends that you continue on your current medications as directed. Please refer to the Current Medication list given to you today.  Labwork: None ordered.  Testing/Procedures: None ordered.  Follow-Up: Your physician wants you to follow-up in: as needed with Dr. Allred.       Any Other Special Instructions Will Be Listed Below (If Applicable).  If you need a refill on your cardiac medications before your next appointment, please call your pharmacy.   

## 2017-05-05 ENCOUNTER — Encounter: Payer: Self-pay | Admitting: Physician Assistant

## 2017-06-21 ENCOUNTER — Encounter: Payer: Self-pay | Admitting: Family Medicine

## 2018-02-18 IMAGING — MR MR MRA NECK WO/W CM
4 of 6 series · 19 of 48 positions shown · IV contrast (Yes MH)
Comparison: None.

CLINICAL DATA: Syncope while urinating yesterday morning.

EXAM:
MRA NECK WITHOUT AND WITH CONTRAST
TECHNIQUE: Multiplanar and multiecho pulse sequences of the neck were obtained
without and with intravenous contrast. Angiographic images of the
neck were obtained using MRA technique without and with intravenous
contrast.
CONTRAST:  15mL MULTIHANCE GADOBENATE DIMEGLUMINE 529 MG/ML IV SOLN

[Series 400: cor cemra ft · coronal · 1.2mm · 0.59mm/px · 8 of 145 slices shown]
[im 1/145]
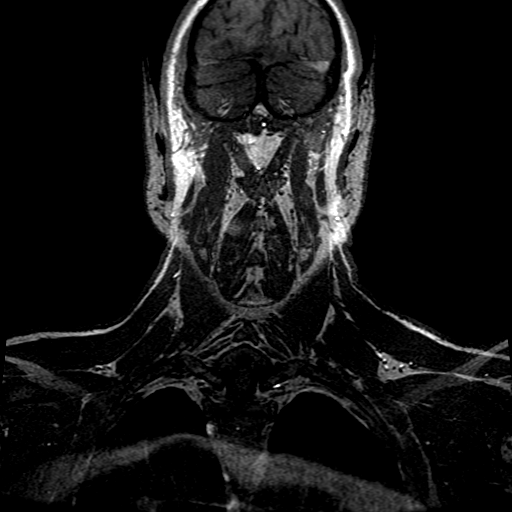
[im 21/145]
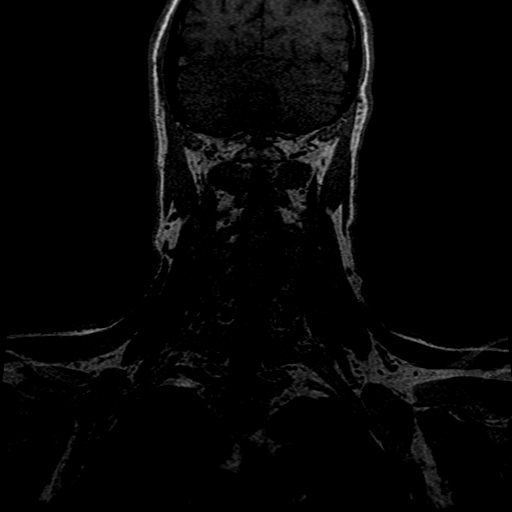
[im 42/145]
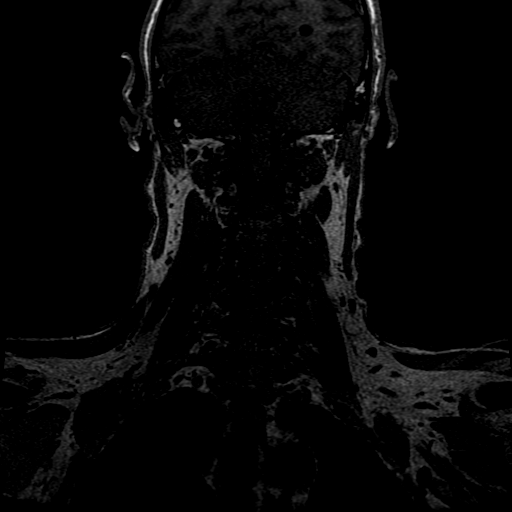
[im 62/145]
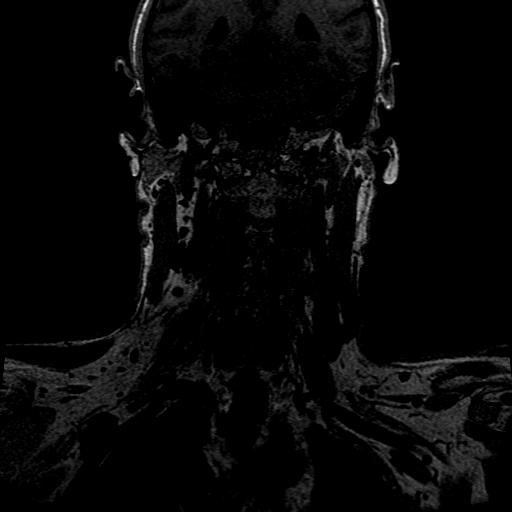
[im 83/145]
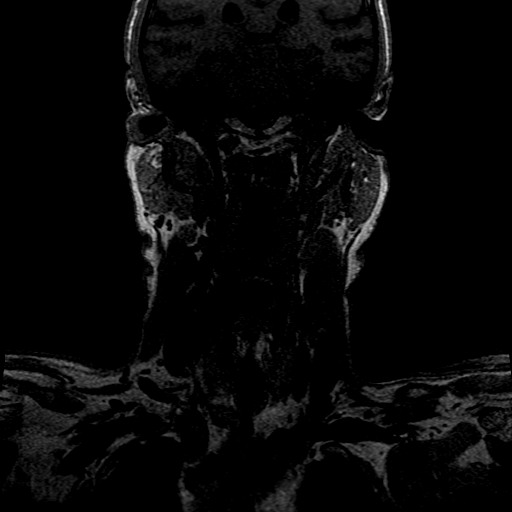
[im 103/145]
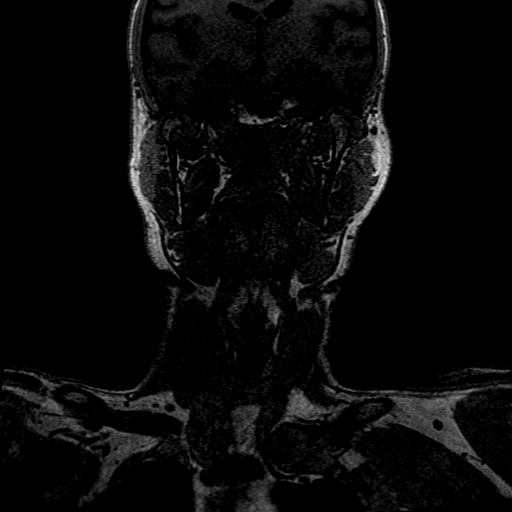
[im 124/145]
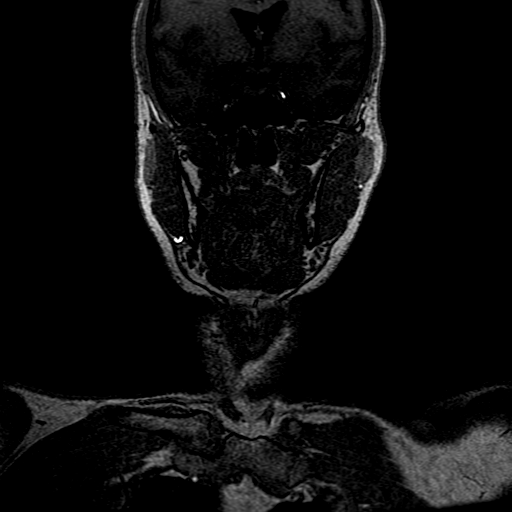
[im 145/145]
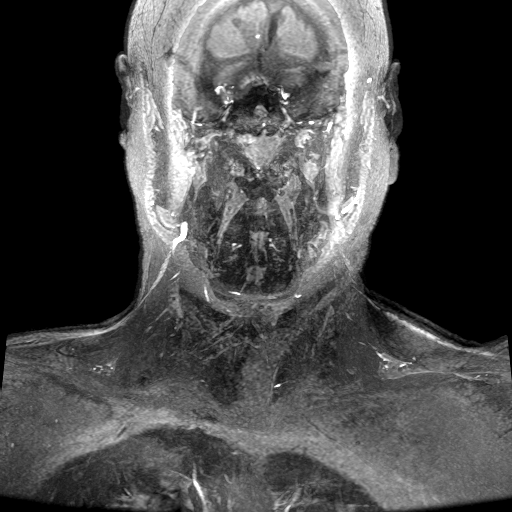

[Series 401: ph1/cor cemra ft · coronal · 1.2mm · 0.59mm/px · 5 of 144 slices shown]
[im 1/144]
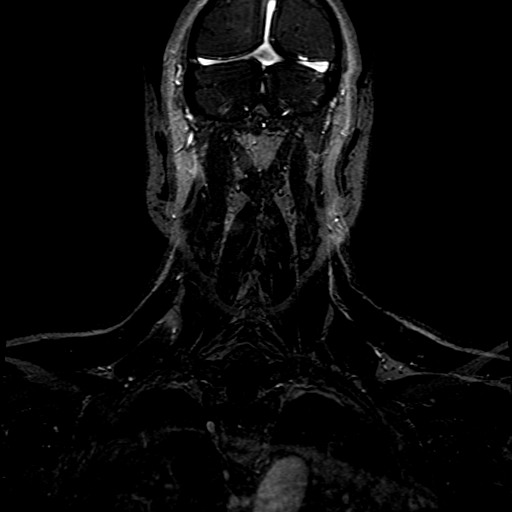
[im 24/144]
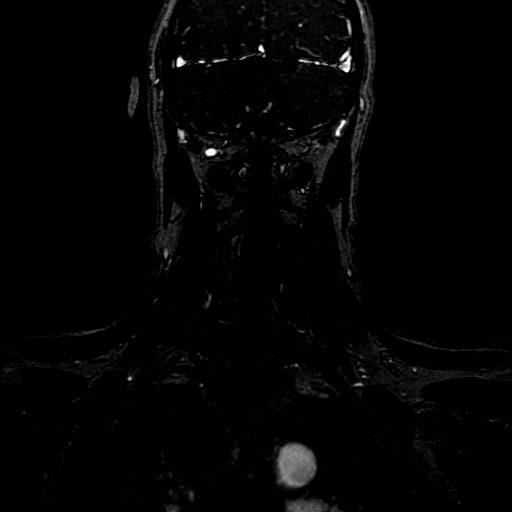
[im 48/144]
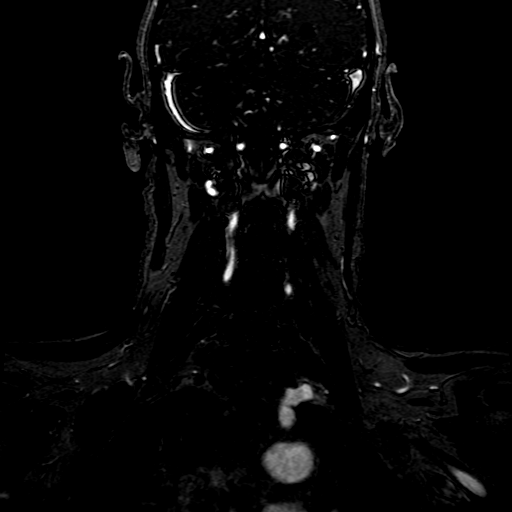
[im 72/144]
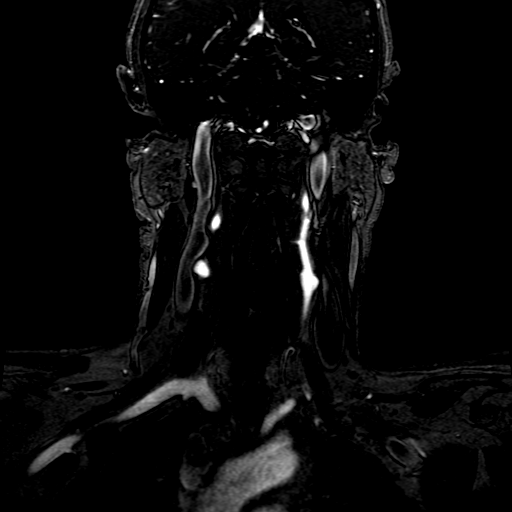
[im 120/144]
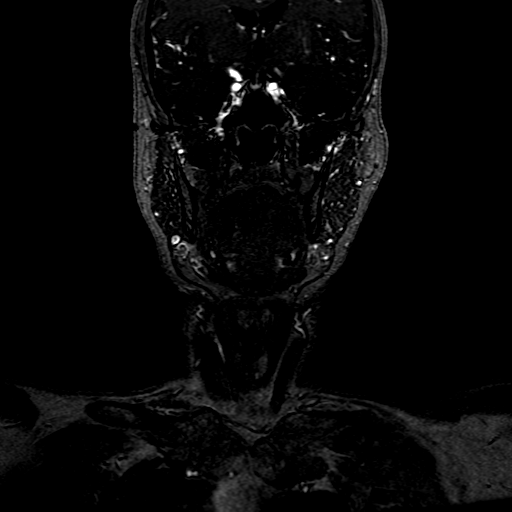

[Series 402: ph2/cor cemra ft · coronal · 1.2mm · 0.59mm/px · 3 of 145 slices shown]
[im 25/145]
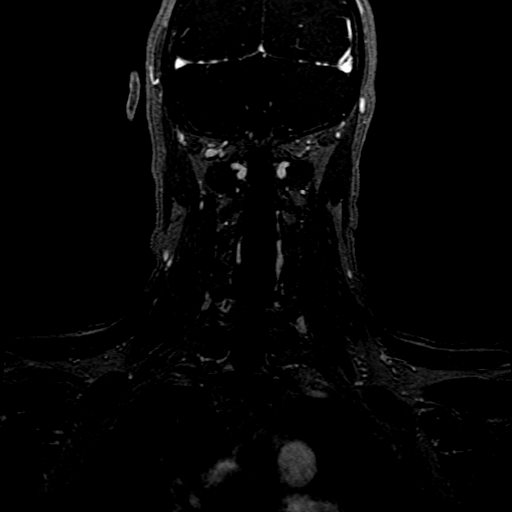
[im 73/145]
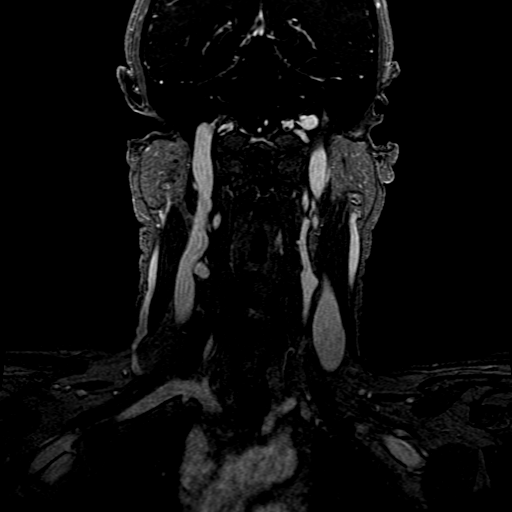
[im 121/145]
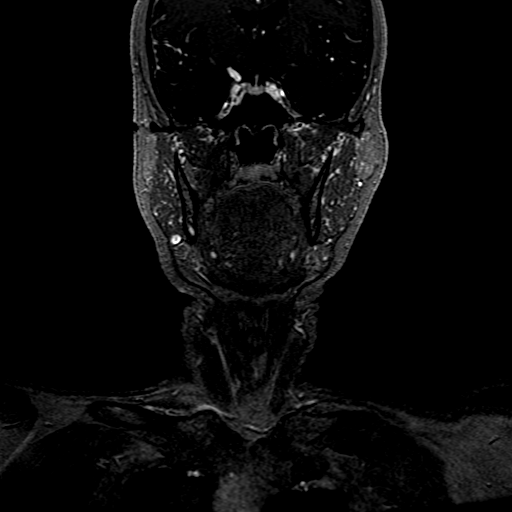

[((id)/401/1)-((id)/400/1) · coronal · 1.2mm · 0.59mm/px · 3 of 145 slices shown]
[im 25/145]
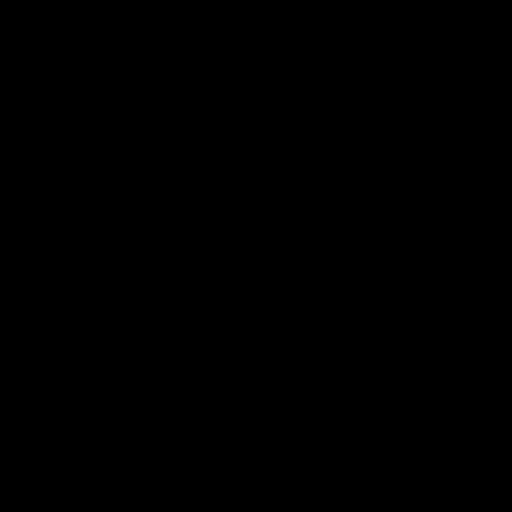
[im 73/145]
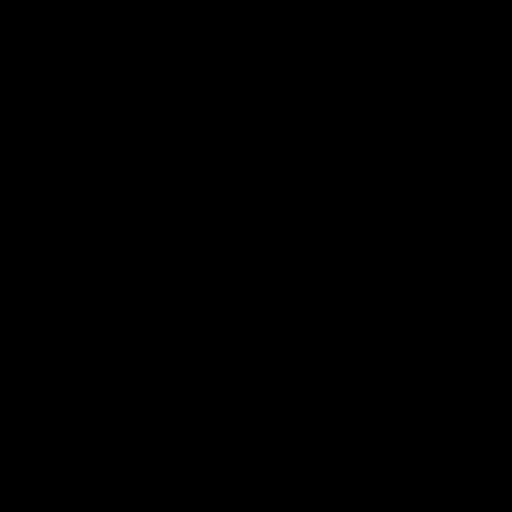
[im 121/145]
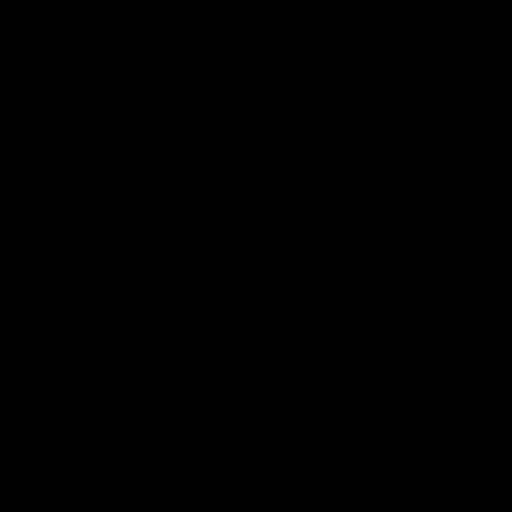

[19 of 48 positions shown; findings below may reference images not displayed]

FINDINGS: The arch has a normal diameter.  Three vessel branching.

Time-of-flight imaging shows antegrade flow in the bilateral carotid
and vertebral circulation. Both carotid and vertebral arteries are
smooth and diffusely patent, no evidence of dissection,
atherosclerosis, or stenosis. Negative visualized intracranial
vessels.
IMPRESSION: Normal neck MRA.  No findings for vertebrobasilar insufficiency.

## 2018-02-23 DIAGNOSIS — H524 Presbyopia: Secondary | ICD-10-CM | POA: Diagnosis not present

## 2018-08-05 MED FILL — PEG-3350 SOLUTION: 420 | 1 days supply | Qty: 4000 | Fill #0

## 2018-08-26 DIAGNOSIS — Z1211 Encounter for screening for malignant neoplasm of colon: Secondary | ICD-10-CM | POA: Diagnosis not present

## 2018-08-26 DIAGNOSIS — K5730 Diverticulosis of large intestine without perforation or abscess without bleeding: Secondary | ICD-10-CM | POA: Diagnosis not present

## 2018-08-26 LAB — HM COLONOSCOPY

## 2019-07-18 DIAGNOSIS — R300 Dysuria: Secondary | ICD-10-CM | POA: Diagnosis not present

## 2019-07-18 DIAGNOSIS — N390 Urinary tract infection, site not specified: Secondary | ICD-10-CM | POA: Diagnosis not present

## 2019-07-18 DIAGNOSIS — R350 Frequency of micturition: Secondary | ICD-10-CM | POA: Diagnosis not present

## 2019-07-18 MED FILL — SULFAMETHOXAZOLE-TMP DS TAB: 800-160 | 14 days supply | Qty: 28 | Fill #0

## 2019-07-25 MED FILL — DOXYCYCLINE HYC 100 MG CAPS: 100 | 7 days supply | Qty: 14 | Fill #0

## 2019-07-25 MED FILL — HYDROXYZINE PAMOATE 25 MG C: 25 | 10 days supply | Qty: 30 | Fill #0

## 2019-10-03 DIAGNOSIS — H348322 Tributary (branch) retinal vein occlusion, left eye, stable: Secondary | ICD-10-CM | POA: Diagnosis not present

## 2020-04-18 DIAGNOSIS — L820 Inflamed seborrheic keratosis: Secondary | ICD-10-CM | POA: Diagnosis not present

## 2020-07-15 ENCOUNTER — Other Ambulatory Visit: Payer: Self-pay

## 2020-07-15 ENCOUNTER — Encounter: Payer: Self-pay | Admitting: Family Medicine

## 2020-07-15 ENCOUNTER — Other Ambulatory Visit (HOSPITAL_COMMUNITY): Payer: Self-pay

## 2020-07-15 ENCOUNTER — Ambulatory Visit (INDEPENDENT_AMBULATORY_CARE_PROVIDER_SITE_OTHER): Payer: BLUE CROSS/BLUE SHIELD | Admitting: Family Medicine

## 2020-07-15 VITALS — BP 124/66 | HR 71 | Temp 98.3°F | Resp 16 | Ht 70.0 in | Wt 171.6 lb

## 2020-07-15 DIAGNOSIS — Z13220 Encounter for screening for lipoid disorders: Secondary | ICD-10-CM

## 2020-07-15 DIAGNOSIS — Z131 Encounter for screening for diabetes mellitus: Secondary | ICD-10-CM

## 2020-07-15 DIAGNOSIS — N402 Nodular prostate without lower urinary tract symptoms: Secondary | ICD-10-CM | POA: Diagnosis not present

## 2020-07-15 DIAGNOSIS — Z87898 Personal history of other specified conditions: Secondary | ICD-10-CM | POA: Diagnosis not present

## 2020-07-15 DIAGNOSIS — N419 Inflammatory disease of prostate, unspecified: Secondary | ICD-10-CM | POA: Diagnosis not present

## 2020-07-15 DIAGNOSIS — Z23 Encounter for immunization: Secondary | ICD-10-CM

## 2020-07-15 MED ORDER — CIPROFLOXACIN HCL 500 MG PO TABS
500.0000 mg | ORAL_TABLET | Freq: Two times a day (BID) | ORAL | 0 refills | Status: AC
  Filled 2020-07-15: qty 28, 14d supply, fill #0

## 2020-07-15 NOTE — Progress Notes (Signed)
Subjective:  Patient ID: Gregory Petersen, male    DOB: 1955/09/01  Age: 65 y.o. MRN: [id number]  CC:  Chief Complaint  Patient presents with   Establish Care    Pt reestablishing care, not seen since 2017 by [name], no concerns today.  Due for shingles vaccine pt agreeable     HPI JAIDEEP Petersen presents for   establish care, I last saw him in January 2019.  Prior patient of Dr. Nilda Simmer.  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  Syncopal episode Previous evaluation for syncopal episode early in the morning in 2019. Mildly prolonged QTC interval  Echo in January 2019 with EF 55 to 60%.  Normal wall motion.  No wall motion abnormalities.  Mild mitral regurgitation. Event monitor in January 2019 with normal sinus rhythm, no significant arrhythmias.  Had been using topical sporanox - for many months. Not now.  No further episodes of near syncope/syncope.  No CP/palpitations.  mountain biking, kayaking, camping, hiking, without difficulty.feels well. 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  Biking daily.   History of prostatitis: No FH of prostate CA.  Treated with antibiotics years ago. Flairs few times per year initially, now every month or so. Frequent urination, incomplete emptying. No recent fever. Min abd soreness.  Last flare - current - past 2- 3 weeks. No fever. About the same sx's. Not improved.  Biking daily. Urology eval in 2013 - prior prostate nodule, hematuria eval. No prostate bx.  Lab Results  Component Value Date   PSA1 2.5 09/16/2016   Lab Results  Component Value Date   CREATININE 1.07 03/01/2017    Followed by dermatology. Dr. Margo Aye prior [facility] D[facility] Dr. Donzetta Starch now.   Melatonin for sleep - working well.   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 Has epipen.  No prior reaction to bee sting, but had yellow jacket stings - 65 stings a few years ago.   Hyperlipidemia: Borderline LDL prior - last ate 4.5 hrs ago. No current meds or supplements.   The ASCVD Risk score Denman George DC Jr., et al., 2013) failed to calculate for the following reasons:   Cannot find a previous HDL lab   Cannot find a previous total cholesterol lab  Lab Results  Component Value Date   CHOL 197 02/19/2017   HDL 49 02/19/2017   LDLCALC 125 (H) 02/19/2017   TRIG 114 02/19/2017   CHOLHDL 4.0 02/19/2017   Lab Results  Component Value Date   ALT 33 03/01/2017   AST 25 03/01/2017   ALKPHOS 95 03/01/2017   BILITOT 0.3 03/01/2017      Health maintenance Shingrix given today.   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 Authorizing Provider  EPINEPHrine 0.3 mg/0.3 mL IJ SOAJ injection Inject 0.3 mLs (0.3 mg total) into the muscle once. 09/07/15  Yes Kindl, Quita Skye, MD  Melatonin 5 MG TABS Take 5 mg by mouth at bedtime.   Yes [provider]  naproxen sodium (ALEVE) 220 MG tablet Take 440 mg by mouth as needed (pain).   Yes [provider]   Social History   Socioeconomic  History   Marital status: Married    Spouse name: Not on file   Number of children: 1   Years of education: Not on file   Highest education level: Not on file  Occupational History   Not on file  Tobacco Use   Smoking status: Former    Pack years: 0.00    Types: Cigarettes    Quit date: 06/16/1989    Years since quitting: 31.1   Smokeless tobacco: Never  Vaping Use   Vaping Use: Never used  Substance and Sexual Activity   Alcohol use: Yes    Comment: occasional   Drug use: No   Sexual activity: Yes    Birth control/protection: None  Other Topics Concern   Not on file  Social History Narrative   Marital status: married x 1985      Children: 1 daughter (53), 2 grandchildren      Lives: with wife      Employment: Pension scheme manager; worked in [employer] x 22 years      Tobacco: none      Alcohol:  5-10 drinks per week. Craft beers      Exercise: gym 3-4 times per week; rides a bike daily.  Rock climbing.  [employer].    Social Determinants of Health   Financial Resource Strain: Not on file  Food Insecurity: Not on file  Transportation Needs: Not on file  Physical Activity: Not on file  Stress: Not on file  Social Connections: Not on file  Intimate Partner Violence: Not on file    Review of Systems Per HPI  Objective:   Vitals:   07/15/20 1456  BP: 124/66  Pulse: 71  Resp: 16  Temp: 98.3 F (36.8 C)  TempSrc: Temporal  SpO2: 98%  Weight: 171 lb 9.6 oz (77.8 kg)  Height: 5\' 10"  (1.778 m)     Physical Exam Vitals reviewed.  Constitutional:      Appearance: He is well-developed.  HENT:     Head: Normocephalic and atraumatic.  Neck:     Vascular: No carotid bruit or JVD.  Cardiovascular:     Rate and Rhythm: Normal rate and regular rhythm.     Heart sounds: Normal heart sounds. No murmur heard. Pulmonary:     Effort: Pulmonary effort is normal.     Breath sounds: Normal breath sounds. No rales.  Abdominal:     Hernia: There is no hernia  in the left inguinal area or right inguinal area.  Genitourinary:    Epididymis:     Right: Normal.     Left: Normal.     Comments: Prostate nontender. Possible firm area left side.  Musculoskeletal:     Right lower leg: No edema.     Left lower leg:  No edema.  Skin:    General: Skin is warm and dry.  Neurological:     Mental Status: He is alert and oriented to person, place, and time.  Psychiatric:        Mood and Affect: Mood normal.       Assessment & Plan:  RISHAN OYAMA is a 65 y.o. male . 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  Current flare as above.  Start Cipro, tendinopathy/tendon rupture risks discussed.  Check PSA, microscopy and urine culture.  Refer to urology given increased frequency of flares and previous history of prostate nodule.  Need for shingles vaccine - Plan: Varicella-zoster vaccine IM  Screening for diabetes mellitus - Plan: Comprehensive metabolic panel  Screening for hyperlipidemia - Plan: Lipid panel  History of syncope - Plan: Comprehensive metabolic panel, CBC  -No recurrence, borderline/mild prolonged QT prior but overall reassuring cardiac work-up.  Routine labs as above, RTC/ER precautions if near syncope/syncopal symptoms return.  Meds ordered this encounter  Medications   ciprofloxacin (CIPRO) 500 MG tablet    Sig: Take 1 tablet (500 mg total) by mouth 2 (two) times daily for 14 days.    Dispense:  28 tablet    Refill:  0   Patient Instructions  I will refer you to urology to discuss the increasing frequency of prostatitis or suspected prostatitis as well as to follow-up on prostate nodule.  For now I did send in an antibiotic to take over the next 2 weeks and we will check your labs  as we discussed.  If any fevers, increase in abdominal pain/pelvic pain, or difficulty producing urine, make sure to be seen right away.  I will check your kidney, liver test, cholesterol test as well.  Repeat shingles vaccine in 2 to 6 months.  Let me know if there are questions and take care.   Return to the clinic or go to the nearest emergency room if any of your symptoms worsen or new symptoms occur.    Signed,   Meredith Staggers, MD [facility], [facility] 07/15/20 5:49 PM

## 2020-07-15 NOTE — Patient Instructions (Addendum)
I will refer you to urology to discuss the increasing frequency of prostatitis or suspected prostatitis as well as to follow-up on prostate nodule.  For now I did send in an antibiotic to take over the next 2 weeks and we will check your labs as we discussed.  If any fevers, increase in abdominal pain/pelvic pain, or difficulty producing urine, make sure to be seen right away.  I will check your kidney, liver test, cholesterol test as well.  Repeat shingles vaccine in 2 to 6 months.  Let me know if there are questions and take care.   Return to the clinic or go to the nearest emergency room if any of your symptoms worsen or new symptoms occur.

## 2020-07-16 ENCOUNTER — Other Ambulatory Visit (HOSPITAL_COMMUNITY): Payer: Self-pay

## 2020-07-16 LAB — COMPREHENSIVE METABOLIC PANEL
ALT: 20 U/L (ref 0–53)
AST: 21 U/L (ref 0–37)
Albumin: 4.6 g/dL (ref 3.5–5.2)
Alkaline Phosphatase: 68 U/L (ref 39–117)
BUN: 15 mg/dL (ref 6–23)
CO2: 25 mEq/L (ref 19–32)
Calcium: 9.3 mg/dL (ref 8.4–10.5)
Chloride: 104 mEq/L (ref 96–112)
Creatinine, Ser: 1.05 mg/dL (ref 0.40–1.50)
GFR: 75.48 mL/min (ref >60.00)
Glucose, Bld: 100 mg/dL — ABNORMAL HIGH (ref 70–99)
Potassium: 4.4 mEq/L (ref 3.5–5.1)
Sodium: 137 mEq/L (ref 135–145)
Total Bilirubin: 0.4 mg/dL (ref 0.2–1.2)
Total Protein: 7.1 g/dL (ref 6.0–8.3)

## 2020-07-16 LAB — LIPID PANEL
Cholesterol: 258 mg/dL — ABNORMAL HIGH (ref 0–200)
HDL: 58.3 mg/dL (ref >39.00)
NonHDL: 199.25
Total CHOL/HDL Ratio: 4
Triglycerides: 318 mg/dL — ABNORMAL HIGH (ref 0.0–149.0)
VLDL: 63.6 mg/dL — ABNORMAL HIGH (ref 0.0–40.0)

## 2020-07-16 LAB — CBC
HCT: 36.8 % — ABNORMAL LOW (ref 39.0–52.0)
Hemoglobin: 12.1 g/dL — ABNORMAL LOW (ref 13.0–17.0)
MCHC: 33 g/dL (ref 30.0–36.0)
MCV: 79 fl (ref 78.0–100.0)
Platelets: 224 10*3/uL (ref 150.0–400.0)
RBC: 4.65 Mil/uL (ref 4.22–5.81)
RDW: 16.9 % — ABNORMAL HIGH (ref 11.5–15.5)
WBC: 6.5 10*3/uL (ref 4.0–10.5)

## 2020-07-16 LAB — PSA: PSA: 2.46 ng/mL (ref 0.10–4.00)

## 2020-07-16 LAB — LDL CHOLESTEROL, DIRECT: Direct LDL: 171 mg/dL

## 2020-07-16 LAB — URINE CULTURE
MICRO NUMBER:: 12027239
Result:: NO GROWTH
SPECIMEN QUALITY:: ADEQUATE

## 2020-07-17 ENCOUNTER — Telehealth: Payer: Self-pay | Admitting: Family Medicine

## 2020-07-17 DIAGNOSIS — Z23 Encounter for immunization: Secondary | ICD-10-CM | POA: Diagnosis not present

## 2020-07-17 DIAGNOSIS — N419 Inflammatory disease of prostate, unspecified: Secondary | ICD-10-CM | POA: Diagnosis not present

## 2020-07-17 NOTE — Telephone Encounter (Signed)
Concerns about prior prolonged QT and use of cipro. Most recent EKG with Dr. Johney Frame din not read as prolonged QT - upper limits of normal. Option to repeat EKG to eval QTc prior to cipro or change to doxy. He plans to take cipro and if any palpitatins or new symptoms to be seen right away. Can change to doxycyline if he changes his mind. PSA and urine cx reassuring.

## 2020-07-17 NOTE — Telephone Encounter (Signed)
Patient has concerns about taking the cipro that Dr. [name] prescribed to him because of a heart condition - Please advise

## 2020-08-29 ENCOUNTER — Other Ambulatory Visit (HOSPITAL_COMMUNITY): Payer: Self-pay

## 2020-08-29 DIAGNOSIS — N411 Chronic prostatitis: Secondary | ICD-10-CM | POA: Diagnosis not present

## 2020-08-29 DIAGNOSIS — N401 Enlarged prostate with lower urinary tract symptoms: Secondary | ICD-10-CM | POA: Diagnosis not present

## 2020-08-29 DIAGNOSIS — N402 Nodular prostate without lower urinary tract symptoms: Secondary | ICD-10-CM | POA: Diagnosis not present

## 2020-08-29 MED ORDER — LEVOFLOXACIN 750 MG PO TABS
ORAL_TABLET | ORAL | 0 refills | Status: DC
  Filled 2020-08-29: qty 1, 1d supply, fill #0

## 2020-10-02 DIAGNOSIS — N402 Nodular prostate without lower urinary tract symptoms: Secondary | ICD-10-CM | POA: Diagnosis not present

## 2020-10-02 DIAGNOSIS — N4289 Other specified disorders of prostate: Secondary | ICD-10-CM | POA: Diagnosis not present

## 2020-10-02 DIAGNOSIS — D075 Carcinoma in situ of prostate: Secondary | ICD-10-CM | POA: Diagnosis not present

## 2020-10-14 ENCOUNTER — Encounter: Payer: Self-pay | Admitting: Family Medicine

## 2020-10-14 DIAGNOSIS — N411 Chronic prostatitis: Secondary | ICD-10-CM | POA: Diagnosis not present

## 2020-10-14 DIAGNOSIS — R350 Frequency of micturition: Secondary | ICD-10-CM | POA: Diagnosis not present

## 2020-10-14 DIAGNOSIS — D075 Carcinoma in situ of prostate: Secondary | ICD-10-CM | POA: Diagnosis not present

## 2020-10-14 DIAGNOSIS — N402 Nodular prostate without lower urinary tract symptoms: Secondary | ICD-10-CM | POA: Diagnosis not present

## 2021-05-03 DIAGNOSIS — M25572 Pain in left ankle and joints of left foot: Secondary | ICD-10-CM | POA: Diagnosis not present

## 2021-09-22 DIAGNOSIS — H5203 Hypermetropia, bilateral: Secondary | ICD-10-CM | POA: Diagnosis not present

## 2022-05-28 ENCOUNTER — Other Ambulatory Visit (HOSPITAL_COMMUNITY): Payer: Self-pay

## 2022-05-28 ENCOUNTER — Ambulatory Visit: Payer: BC Managed Care – PPO | Admitting: Family Medicine

## 2022-05-28 ENCOUNTER — Encounter: Payer: Self-pay | Admitting: Family Medicine

## 2022-05-28 VITALS — BP 132/80 | HR 72 | Temp 98.7°F | Ht 70.0 in | Wt 167.4 lb

## 2022-05-28 DIAGNOSIS — N402 Nodular prostate without lower urinary tract symptoms: Secondary | ICD-10-CM | POA: Diagnosis not present

## 2022-05-28 DIAGNOSIS — E785 Hyperlipidemia, unspecified: Secondary | ICD-10-CM | POA: Diagnosis not present

## 2022-05-28 DIAGNOSIS — R062 Wheezing: Secondary | ICD-10-CM

## 2022-05-28 DIAGNOSIS — Z23 Encounter for immunization: Secondary | ICD-10-CM | POA: Diagnosis not present

## 2022-05-28 DIAGNOSIS — E010 Iodine-deficiency related diffuse (endemic) goiter: Secondary | ICD-10-CM

## 2022-05-28 DIAGNOSIS — D649 Anemia, unspecified: Secondary | ICD-10-CM | POA: Diagnosis not present

## 2022-05-28 DIAGNOSIS — J309 Allergic rhinitis, unspecified: Secondary | ICD-10-CM

## 2022-05-28 DIAGNOSIS — Z0000 Encounter for general adult medical examination without abnormal findings: Secondary | ICD-10-CM | POA: Diagnosis not present

## 2022-05-28 MED ORDER — ALBUTEROL SULFATE HFA 108 (90 BASE) MCG/ACT IN AERS
1.0000 | INHALATION_SPRAY | Freq: Four times a day (QID) | RESPIRATORY_TRACT | 0 refills | Status: DC | PRN
  Filled 2022-05-28: qty 6.7, 25d supply, fill #0

## 2022-05-28 NOTE — Patient Instructions (Addendum)
Try flonase nasal spray with new technique. Albuterol if needed but if needed sooner than 4 hours or more than 2 days in a row, please be seen.  If anemia test is still low, return for other tests and recommendations.  If any other concerns on labs I will let you know.  Flu and covid vaccine this fall.  I will order thyroid test and ultrasound for possible slight enlarged thyroid.  Thanks for coming in today and let me know if there are questions.   Keeping you healthy  Get these tests Blood pressure- Have your blood pressure checked once a year by your healthcare provider.  Normal blood pressure is 120/80 Weight- Have your body mass index (BMI) calculated to screen for obesity.  BMI is a measure of body fat based on height and weight. You can also calculate your own BMI at [email or web address]. Cholesterol- Have your cholesterol checked every year. Diabetes- Have your blood sugar checked regularly if you have high blood pressure, high cholesterol, have a family history of diabetes or if you are overweight. Screening for Colon Cancer- Colonoscopy starting at age 3.  Screening may begin sooner depending on your family history and other health conditions. Follow up colonoscopy as directed by your Gastroenterologist. Screening for Prostate Cancer- Both blood work (PSA) and a rectal exam help screen for Prostate Cancer.  Screening begins at age 32 with African-American men and at age 55 with Caucasian men.  Screening may begin sooner depending on your family history.  Take these medicines Aspirin- One aspirin daily can help prevent Heart disease and Stroke. Flu shot- Every fall. Tetanus- Every 10 years. Zostavax- Once after the age of 9 to prevent Shingles. Pneumonia shot- Once after the age of 22; if you are younger than 60, ask your healthcare provider if you need a Pneumonia shot.  Take these steps Don't smoke- If you do smoke, talk to your doctor about quitting.  For tips on how to  quit, go to www.smokefree.gov or call 1-800-QUIT-NOW. Be physically active- Exercise 5 days a week for at least 30 minutes.  If you are not already physically active start slow and gradually work up to 30 minutes of moderate physical activity.  Examples of moderate activity include walking briskly, mowing the yard, dancing, swimming, bicycling, etc. Eat a healthy diet- Eat a variety of healthy food such as fruits, vegetables, low fat milk, low fat cheese, yogurt, lean meant, poultry, fish, beans, tofu, etc. For more information go to www.thenutritionsource.org Drink alcohol in moderation- Limit alcohol intake to less than two drinks a day. Never drink and drive. Dentist- Brush and floss twice daily; visit your dentist twice a year. Depression- Your emotional health is as important as your physical health. If you're feeling down, or losing interest in things you would normally enjoy please talk to your healthcare provider. Eye exam- Visit your eye doctor every year. Safe sex- If you may be exposed to a sexually transmitted infection, use a condom. Seat belts- Seat belts can save your life; always wear one. Smoke/Carbon Monoxide detectors- These detectors need to be installed on the appropriate level of your home.  Replace batteries at least once a year. Skin cancer- When out in the sun, cover up and use sunscreen 15 SPF or higher. Violence- If anyone is threatening you, please tell your healthcare provider. Living Will/ Health care power of attorney- Speak with your healthcare provider and family.

## 2022-05-28 NOTE — Progress Notes (Signed)
Subjective:  Patient ID: Gregory Petersen, male    DOB: 30-Apr-1955  Age: 67 y.o. MRN: [id number]  CC:  Chief Complaint  Patient presents with   Annual Exam    Pt wants to discuss medication to help with hay fever.     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   Allergic rhinitis, hay fever.  Prior use of otc allergy meds - antihistamines.  Felt like made thick mucus. Some wheeze at times - seasonal with white flowers. Tried mucinex for 2 weeks, no relief. Nasal sprays caused stomach upset.  No fever. No known hx of asthma but has albuterol in past as needed.    Prostate nodule referred to urology and evaluated in September 2022 for symptoms of chronic prostatitis, prostate nodule. 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  Annual PSA recommended.  No routine biopsy recommended. 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  Lab Results  Component Value Date   PSA1 2.5 09/16/2016   PSA 2.46 07/15/2020   Lab Results  Component Value Date   CREATININE 1.05 07/15/2020     Hyperlipidemia: Elevated labs in 2022.  Nonfasting.  Recommended repeat after 8 hours of fasting. The 10-year ASCVD risk score (Arnett DK, et al., 2019) is: 14.6%   Values used to calculate the score:     Age: 67 years     Sex: Male     Is Non-Hispanic African American: No     Diabetic: No     Tobacco smoker: No     Systolic Blood Pressure: 132 mmHg     Is BP treated: No     HDL Cholesterol: 58.3 mg/dL     Total Cholesterol: 258 mg/dL  Lab Results  Component Value Date   CHOL 258 (H) 07/15/2020   HDL 58.30 07/15/2020   LDLCALC 125 (H) 02/19/2017   LDLDIRECT 171.0 07/15/2020   TRIG 318.0 (H) 07/15/2020   CHOLHDL 4 07/15/2020   Lab Results  Component Value Date   ALT 20 07/15/2020   AST 21 07/15/2020   ALKPHOS 68 07/15/2020   BILITOT 0.4 07/15/2020    Anemia Borderline hemoglobin at 12.1 in 2022 compared to 12.9 in 2019.  1 month follow-up recommended for repeat testing.  Has not had testing since his June 2022 visit.  Denies melena, hematochezia, lightheadedness, dizziness or new fatigue. Biking, kayaking without sx's.  Lab Results  Component Value Date   WBC 6.5 07/15/2020   HGB 12.1 (L) 07/15/2020   HCT 36.8 (L) 07/15/2020   MCV 79.0 07/15/2020   PLT 224.0 07/15/2020      05/28/2022    1:00 PM 07/15/2020    3:00 PM 03/01/2017   11:28 AM 02/24/2017    8:39 AM 02/19/2017    8:50 AM  Depression screen PHQ 2/9  Decreased Interest 0 0 0 0 0  Down, Depressed, Hopeless 0 0 0 0 0  PHQ - 2 Score 0 0 0 0 0  Altered sleeping 0 0     Tired, decreased energy 0 0     Change in appetite 0 0     Feeling bad or failure about yourself  0 0     Trouble concentrating 0 0     Moving slowly or fidgety/restless 0 0     Suicidal thoughts 0 0     PHQ-9  Score 0 0     Difficult doing work/chores Not difficult at all        Health Maintenance  Topic Date Due   COVID-19 Vaccine (1) Never done   Zoster Vaccines- Shingrix (2 of 2) 09/11/2020   Pneumonia Vaccine 36+ Years old (1 of 1 - PCV) Never done   INFLUENZA VACCINE  08/27/2022   COLONOSCOPY (Pts 45-90yrs Insurance coverage will need to be confirmed)  11/01/2023   DTaP/Tdap/Td (2 - Td or Tdap) 09/17/2026   Hepatitis C Screening  Completed   HIV Screening  Completed   HPV VACCINES  Aged Out  Colonoscopy few years ago - Dr. Randa Evens. ? Repeat 10 yrs.  Prostate: as above - repeat today, then urology if follow up if higher.  No new urinary symptoms. No hematuria/urgency.  Nocturia once per night.  Lab Results  Component Value Date   PSA1 2.5 09/16/2016   PSA 2.46 07/15/2020   Immunization History  Administered Date(s) Administered   Tdap 09/16/2016   Zoster Recombinat (Shingrix) 07/17/2020   Zoster, Live 05/28/2022  2nd shingrix today.  Covid vaccine -had recent booster.   No results  found. Wears glasses. Appt with optho this spring. No changes.   Dental:every 6 months.   Alcohol: 8-10 per week.  No DUI.   Tobacco: none.   Exercise: biking, kayaking, [employer], juggle. Over per week.    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 Authorizing Provider  EPINEPHrine 0.3 mg/0.3 mL IJ SOAJ injection Inject 0.3 mLs (0.3 mg total) into the muscle once. 09/07/15  Yes Kindl, Quita Skye, MD  Melatonin 5 MG TABS Take 5 mg by mouth at bedtime.   Yes [provider]  naproxen sodium (ALEVE) 220 MG tablet Take 440 mg by mouth as needed (pain).   Yes [provider]  levofloxacin (LEVAQUIN) 750 MG tablet Take 1 tablet by mouth 1 hour prior to biopsy Patient not taking: Reported on 05/28/2022 08/29/20      Social History   Socioeconomic History   Marital status: Married    Spouse name: Not on file   Number of children: 1   Years of education: Not on file   Highest education level: Not on file  Occupational History   Not on file  Tobacco Use   Smoking status: Former    Types: Cigarettes    Quit date: 06/16/1989    Years since quitting:  32.9   Smokeless tobacco: Never  Vaping Use   Vaping Use: Never used  Substance and Sexual  Activity   Alcohol use: Yes    Comment: occasional   Drug use: No   Sexual activity: Yes    Birth control/protection: None  Other Topics Concern   Not on file  Social History Narrative   Marital status: married x 1985      Children: 1 daughter (7), 2 grandchildren      Lives: with wife      Employment: Pension scheme manager; worked in [employer] x 22 years      Tobacco: none      Alcohol:  5-10 drinks per week. Craft beers      Exercise: gym 3-4 times per week; rides a bike daily.  Rock climbing.  [employer].    Social Determinants of Health   Financial Resource Strain: Not on file  Food Insecurity: Not on file  Transportation Needs: Not on file  Physical Activity: Not on file  Stress: Not on file  Social Connections: Not on file  Intimate Partner Violence: Not on file    Review of Systems  13 point review of systems per patient health survey noted.  Negative other than as indicated above or in HPI.   Objective:   Vitals:   05/28/22 1301  BP: 132/80  Pulse: 72  Temp: 98.7 F (37.1 C)  TempSrc: Temporal  SpO2: 100%  Weight: 167 lb 6.4 oz (75.9 kg)  Height: 5\' 10"  (1.778 m)     Physical Exam Vitals reviewed.  Constitutional:      Appearance: He is well-developed.  HENT:     Head: Normocephalic and atraumatic.     Right Ear: External ear normal.     Left Ear: External ear normal.  Eyes:     Conjunctiva/sclera: Conjunctivae normal.     Pupils: Pupils are equal, round, and reactive to light.  Neck:     Thyroid: No thyromegaly.     Comments: Prominent right and left thyroid without tenderness or focal nodularity. Cardiovascular:     Rate and Rhythm: Normal rate and regular rhythm.     Heart sounds: Normal heart sounds.  Pulmonary:     Effort: Pulmonary effort is normal. No respiratory distress.     Breath sounds: Normal breath sounds. No wheezing.   Abdominal:     General: There is no distension.     Palpations: Abdomen is soft.     Tenderness: There is no abdominal tenderness.  Musculoskeletal:        General: No tenderness. Normal range of motion.     Cervical back: Normal range of motion and neck supple.  Lymphadenopathy:     Cervical: No cervical adenopathy.  Skin:    General: Skin is warm and dry.  Neurological:     Mental Status: He is alert and oriented to person, place, and time.     Deep Tendon Reflexes: Reflexes are normal and symmetric.  Psychiatric:        Behavior: Behavior normal.        Assessment & Plan:  Gregory Petersen is a 67 y.o. male . Annual physical exam  - -anticipatory guidance as below in AVS, screening labs above. Health maintenance items as above in HPI discussed/recommended as applicable.    hyperlipidemia - Plan: Comprehensive metabolic panel, Lipid panel  -No current meds.  Check labs, then adjust plan accordingly, prior elevated ASCVD risk score.  Option of statin if elevated or coronary calcium scoring to help assist with decision if needed.  Need for shingles vaccine - Plan: Varicella-zoster  vaccine subcutaneous  Prostate nodule - Plan: PSA  Workup with urology as above, check PSA and if stable can continue yearly monitoring.  Urology follow-up if elevated PSA.  Wheezing - Plan: albuterol (VENTOLIN HFA) 108 (90 Base) MCG/ACT inhaler Allergic rhinitis, unspecified seasonality, unspecified trigger  - Steroid nasal spray discussed as initial approach, and we discussed the correct technique.  Albuterol if needed for wheezing with RTC precautions if frequent or persistent need.  Thyromegaly - Plan: TSH, US THYROID  -Slight thyromegaly on exam but no palpable nodules.  Check imaging, TSH  Anemia, unspecified type - Plan: CBC  -Mild anemia previously, asymptomatic.  Will clarify most recent colonoscopy and follow-up interval.  Repeat CBC and if still anemic follow-up to discuss other testing  including likely stool testing, iron testing.   Meds ordered this encounter  Medications   albuterol (VENTOLIN HFA) 108 (90 Base) MCG/ACT inhaler    Sig: Inhale 1-2 puffs into the lungs every 6 (six) hours as needed for wheezing or shortness of breath.    Dispense:  8 g    Refill:  0   Patient Instructions  Try flonase nasal spray with new technique. Albuterol if needed but if needed sooner than 4 hours or more than 2 days in a row, please be seen.  If anemia test is still low, return for other tests and recommendations.  If any other concerns on labs I will let you know.  Flu and covid vaccine this fall.  I will order thyoir test and ultrasound for possible slight enlarged thyroid.  Thanks for coming in today and let me know if there are questions.   Keeping you healthy  Get these tests Blood pressure- Have your blood pressure checked once a year by your healthcare provider.  Normal blood pressure is 120/80 Weight- Have your body mass index (BMI) calculated to screen for obesity.  BMI is a measure of body fat based on height and weight. You can also calculate your own BMI at [email or web address]. Cholesterol- Have your cholesterol checked every year. Diabetes- Have your blood sugar checked regularly if you have high blood pressure, high cholesterol, have a family history of diabetes or if you are overweight. Screening for Colon Cancer- Colonoscopy starting at age 64.  Screening may begin sooner depending on your family history and other health conditions. Follow up colonoscopy as directed by your Gastroenterologist. Screening for Prostate Cancer- Both blood work (PSA) and a rectal exam help screen for Prostate Cancer.  Screening begins at age 3 with African-American men and at age 60 with Caucasian men.  Screening may begin sooner depending on your family history.  Take these medicines Aspirin- One aspirin daily can help prevent Heart disease and Stroke. Flu shot- Every  fall. Tetanus- Every 10 years. Zostavax- Once after the age of 39 to prevent Shingles. Pneumonia shot- Once after the age of 37; if you are younger than 56, ask your healthcare provider if you need a Pneumonia shot.  Take these steps Don't smoke- If you do smoke, talk to your doctor about quitting.  For tips on how to quit, go to www.smokefree.gov or call 1-800-QUIT-NOW. Be physically active- Exercise 5 days a week for at least 30 minutes.  If you are not already physically active start slow and gradually work up to 30 minutes of moderate physical activity.  Examples of moderate activity include walking briskly, mowing the yard, dancing, swimming, bicycling, etc. Eat a healthy diet- Eat a variety of healthy food such as fruits,  vegetables, low fat milk, low fat cheese, yogurt, lean meant, poultry, fish, beans, tofu, etc. For more information go to www.thenutritionsource.org Drink alcohol in moderation- Limit alcohol intake to less than two drinks a day. Never drink and drive. Dentist- Brush and floss twice daily; visit your dentist twice a year. Depression- Your emotional health is as important as your physical health. If you're feeling down, or losing interest in things you would normally enjoy please talk to your healthcare provider. Eye exam- Visit your eye doctor every year. Safe sex- If you may be exposed to a sexually transmitted infection, use a condom. Seat belts- Seat belts can save your life; always wear one. Smoke/Carbon Monoxide detectors- These detectors need to be installed on the appropriate level of your home.  Replace batteries at least once a year. Skin cancer- When out in the sun, cover up and use sunscreen 15 SPF or higher. Violence- If anyone is threatening you, please tell your healthcare provider. Living Will/ Health care power of attorney- Speak with your healthcare provider and family.      Signed,   Meredith Staggers, MD [facility], [facility] 05/28/22 2:49 PM

## 2022-05-29 ENCOUNTER — Other Ambulatory Visit (HOSPITAL_COMMUNITY): Payer: Self-pay

## 2022-05-29 LAB — LIPID PANEL
Cholesterol: 224 mg/dL — ABNORMAL HIGH (ref 0–200)
HDL: 61.2 mg/dL (ref >39.00)
LDL Cholesterol: 127 mg/dL — ABNORMAL HIGH (ref 0–99)
NonHDL: 163.19
Total CHOL/HDL Ratio: 4
Triglycerides: 180 mg/dL — ABNORMAL HIGH (ref 0.0–149.0)
VLDL: 36 mg/dL (ref 0.0–40.0)

## 2022-05-29 LAB — CBC
HCT: 35 % — ABNORMAL LOW (ref 39.0–52.0)
Hemoglobin: 11 g/dL — ABNORMAL LOW (ref 13.0–17.0)
MCHC: 31.5 g/dL (ref 30.0–36.0)
MCV: 74.8 fl — ABNORMAL LOW (ref 78.0–100.0)
Platelets: 271 10*3/uL (ref 150.0–400.0)
RBC: 4.68 Mil/uL (ref 4.22–5.81)
RDW: 18 % — ABNORMAL HIGH (ref 11.5–15.5)
WBC: 6.1 10*3/uL (ref 4.0–10.5)

## 2022-05-29 LAB — COMPREHENSIVE METABOLIC PANEL
ALT: 47 U/L (ref 0–53)
AST: 44 U/L — ABNORMAL HIGH (ref 0–37)
Albumin: 4.5 g/dL (ref 3.5–5.2)
Alkaline Phosphatase: 94 U/L (ref 39–117)
BUN: 11 mg/dL (ref 6–23)
CO2: 26 mEq/L (ref 19–32)
Calcium: 9.3 mg/dL (ref 8.4–10.5)
Chloride: 103 mEq/L (ref 96–112)
Creatinine, Ser: 0.93 mg/dL (ref 0.40–1.50)
GFR: 86.18 mL/min (ref >60.00)
Glucose, Bld: 93 mg/dL (ref 70–99)
Potassium: 5.1 mEq/L (ref 3.5–5.1)
Sodium: 140 mEq/L (ref 135–145)
Total Bilirubin: 0.3 mg/dL (ref 0.2–1.2)
Total Protein: 7.3 g/dL (ref 6.0–8.3)

## 2022-05-29 LAB — PSA: PSA: 3.8 ng/mL (ref 0.10–4.00)

## 2022-05-29 LAB — TSH: TSH: 0.95 u[IU]/mL (ref 0.35–5.50)

## 2022-06-08 ENCOUNTER — Other Ambulatory Visit: Payer: BLUE CROSS/BLUE SHIELD

## 2022-07-01 ENCOUNTER — Ambulatory Visit
Admission: RE | Admit: 2022-07-01 | Discharge: 2022-07-01 | Disposition: A | Discharge status: Home / Self Care | Payer: BC Managed Care – PPO | Source: Ambulatory Visit | Attending: Family Medicine | Admitting: Family Medicine

## 2022-07-01 DIAGNOSIS — E010 Iodine-deficiency related diffuse (endemic) goiter: Secondary | ICD-10-CM

## 2022-07-01 DIAGNOSIS — R221 Localized swelling, mass and lump, neck: Secondary | ICD-10-CM | POA: Diagnosis not present

## 2022-07-02 ENCOUNTER — Other Ambulatory Visit (INDEPENDENT_AMBULATORY_CARE_PROVIDER_SITE_OTHER): Payer: BC Managed Care – PPO

## 2022-07-02 ENCOUNTER — Other Ambulatory Visit: Payer: Self-pay | Admitting: Family Medicine

## 2022-07-02 DIAGNOSIS — R7989 Other specified abnormal findings of blood chemistry: Secondary | ICD-10-CM

## 2022-07-02 DIAGNOSIS — E785 Hyperlipidemia, unspecified: Secondary | ICD-10-CM

## 2022-07-02 LAB — COMPREHENSIVE METABOLIC PANEL
ALT: 33 U/L (ref 0–53)
AST: 40 U/L — ABNORMAL HIGH (ref 0–37)
Albumin: 4.6 g/dL (ref 3.5–5.2)
Alkaline Phosphatase: 73 U/L (ref 39–117)
BUN: 11 mg/dL (ref 6–23)
CO2: 26 mEq/L (ref 19–32)
Calcium: 9.5 mg/dL (ref 8.4–10.5)
Chloride: 104 mEq/L (ref 96–112)
Creatinine, Ser: 1.1 mg/dL (ref 0.40–1.50)
GFR: 70.41 mL/min (ref >60.00)
Glucose, Bld: 104 mg/dL — ABNORMAL HIGH (ref 70–99)
Potassium: 5.1 mEq/L (ref 3.5–5.1)
Sodium: 138 mEq/L (ref 135–145)
Total Bilirubin: 0.6 mg/dL (ref 0.2–1.2)
Total Protein: 7.5 g/dL (ref 6.0–8.3)

## 2022-07-02 LAB — LIPID PANEL
Cholesterol: 260 mg/dL — ABNORMAL HIGH (ref 0–200)
HDL: 69.5 mg/dL (ref >39.00)
LDL Cholesterol: 165 mg/dL — ABNORMAL HIGH (ref 0–99)
NonHDL: 190.14
Total CHOL/HDL Ratio: 4
Triglycerides: 127 mg/dL (ref 0.0–149.0)
VLDL: 25.4 mg/dL (ref 0.0–40.0)

## 2022-07-02 NOTE — Progress Notes (Signed)
In office for fasting labs, states he was not fasting for his last lipid panel, and we will also recheck LFT.

## 2022-07-03 ENCOUNTER — Encounter: Payer: Self-pay | Admitting: Family Medicine

## 2022-07-03 ENCOUNTER — Ambulatory Visit (INDEPENDENT_AMBULATORY_CARE_PROVIDER_SITE_OTHER): Payer: BC Managed Care – PPO | Admitting: Family Medicine

## 2022-07-03 VITALS — BP 138/76 | HR 70 | Temp 97.6°F | Ht 70.0 in | Wt 164.7 lb

## 2022-07-03 DIAGNOSIS — D649 Anemia, unspecified: Secondary | ICD-10-CM

## 2022-07-03 DIAGNOSIS — R7989 Other specified abnormal findings of blood chemistry: Secondary | ICD-10-CM | POA: Diagnosis not present

## 2022-07-03 DIAGNOSIS — E785 Hyperlipidemia, unspecified: Secondary | ICD-10-CM | POA: Diagnosis not present

## 2022-07-03 DIAGNOSIS — N4231 Prostatic intraepithelial neoplasia: Secondary | ICD-10-CM | POA: Diagnosis not present

## 2022-07-03 LAB — IFOBT (OCCULT BLOOD): IFOBT: NEGATIVE

## 2022-07-03 LAB — CBC
HCT: 36.2 % — ABNORMAL LOW (ref 39.0–52.0)
Hemoglobin: 11.2 g/dL — ABNORMAL LOW (ref 13.0–17.0)
MCHC: 30.9 g/dL (ref 30.0–36.0)
MCV: 74.2 fl — ABNORMAL LOW (ref 78.0–100.0)
Platelets: 269 10*3/uL (ref 150.0–400.0)
RBC: 4.87 Mil/uL (ref 4.22–5.81)
RDW: 17.6 % — ABNORMAL HIGH (ref 11.5–15.5)
WBC: 6.7 10*3/uL (ref 4.0–10.5)

## 2022-07-03 NOTE — Progress Notes (Signed)
Subjective:  Patient ID: Gregory Petersen, male    DOB: 11-Oct-1955  Age: 67 y.o. MRN: [id number]  CC:  Chief Complaint  Patient presents with   Results    Pt wife has questions about downward trend of hemoglobin, also requesting hemoccult test or iron panel      HPI Gregory Petersen presents for  Follow-up of abnormal labs.  Physical exam  Anemia 13.0 in 2019, then 12.6, 12.9, 12.1 10/28/2020.  Most recent hemoglobin lower at 11.0 with hematocrit 35 on May 2nd.  At that time he denied melena, hematochezia, lightheadedness, dizziness or new fatigue.  Has been active without symptoms.  Had reported colonoscopy few years prior with planned repeat in 10 years. Dr. Randa Evens at [facility].  Still no fatigue/lightheadedness/dizziness, weight loss or other new symptoms.  Lactose intolerant with diarrhea. No blood in stool/melena, no change in stools otherwise.  Minimal red meat as wife with alpha gal. No MVI/supplements.   Increased PSA  Prior biopsies in 2022. Reassuring with urology. Defers further workup at this time. 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  Lab Results  Component Value Date   PSA1 2.5 09/16/2016   PSA 3.80 05/28/2022   PSA 2.46 07/15/2020      Hyperlipidemia: Elevated at May visit but not fasting.  Borderline LFT, isolated LFT elevation at that time.  Repeat testing yesterday fasting with results as below.  No current statin. No abdominal pain, scleral icterus or jaundice. Tylenol: none Alcohol: 1/5 bourbon per week and up to few beers per day Would like to check CCS prior to med start.   The 10-year ASCVD risk score (Arnett DK, et al., 2019) is: 14.3%   Values used to calculate the score:     Age: 75 years     Sex: Male     Is Non-Hispanic African American: No     Diabetic: No     Tobacco smoker: No     Systolic Blood Pressure: 138 mmHg     Is BP treated: No     HDL Cholesterol: 69.5 mg/dL     Total Cholesterol: 260 mg/dL  Lab Results  Component Value Date    CHOL 260 (H) 07/02/2022   HDL 69.50 07/02/2022   LDLCALC 165 (H) 07/02/2022   LDLDIRECT 171.0 07/15/2020   TRIG 127.0 07/02/2022   CHOLHDL 4 07/02/2022   Lab Results  Component Value Date   ALT 33 07/02/2022   AST 40 (H) 07/02/2022   ALKPHOS 73 07/02/2022   BILITOT 0.6 07/02/2022      History Patient Active Problem List   Diagnosis Date Noted   History of nephrectomy    Syncope 02/19/2017   Laceration of nose    Vision abnormalities    Hematuria 06/19/2011   Hemorrhoids, internal, with bleeding & occasional prolapse 06/17/2011   Past Medical History:  Diagnosis Date   Allergy    Hemorrhoids, internal, with bleeding & occasional prolapse 06/17/2011   MEDIAL MENISCUS TEAR, LEFT 12/22/2005   Qualifier: Diagnosis of  By: Mannie Stabile MD, JOSEPH     Nasal congestion    Renal disorder    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 Authorizing Provider  albuterol (VENTOLIN HFA) 108 (90 Base) MCG/ACT inhaler Inhale 1-2 puffs into the lungs every 6 (six) hours as needed for wheezing or shortness of breath. 05/28/22  Yes [name], MD  EPINEPHrine 0.3 mg/0.3 mL IJ SOAJ injection Inject 0.3 mLs (0.3 mg total) into the muscle once. 09/07/15  Yes Kindl, Quita Skye, MD  Melatonin 5 MG TABS Take 5 mg by mouth at bedtime.   Yes [provider]  naproxen sodium (ALEVE) 220 MG tablet Take 440 mg by mouth as needed (pain).   Yes [provider]  levofloxacin (LEVAQUIN) 750 MG tablet Take 1 tablet by mouth 1 hour prior to biopsy Patient not taking: Reported on 05/28/2022 08/29/20      Social History   Socioeconomic History   Marital status: Married    Spouse  name: Not on file   Number of children: 1   Years of education: Not on file   Highest education level: Not on file  Occupational History   Not on file  Tobacco Use   Smoking status: Former    Types: Cigarettes    Quit date: 06/16/1989    Years since quitting: 33.0   Smokeless tobacco: Never  Vaping Use   Vaping Use: Never used  Substance and Sexual Activity   Alcohol use: Yes    Comment: occasional   Drug use: No   Sexual activity: Yes    Birth control/protection: None  Other Topics Concern   Not on file  Social History Narrative   Marital status: married x 1985      Children: 1 daughter (6), 2 grandchildren      Lives: with wife      Employment: Pension scheme manager; worked in [employer] x 22 years      Tobacco: none      Alcohol:  5-10 drinks per week. Craft beers      Exercise: gym 3-4 times per week; rides a bike daily.  Rock climbing.  [employer].    Social Determinants of Health   Financial Resource Strain: Not on file  Food Insecurity: Not on file  Transportation Needs: Not on file  Physical Activity: Not on file  Stress: Not on file  Social Connections: Not on file  Intimate Partner Violence: Not on file    Review of Systems   Objective:   Vitals:   07/03/22 0854  BP: 138/76  Pulse: 70  Temp: 97.6 F (36.4 C)  TempSrc: Temporal  SpO2: 99%  Weight: 164 lb 11.2 oz (74.7 kg)  Height: 5\' 10"  (1.778 m)     Physical Exam Vitals reviewed.  Constitutional:      Appearance: He is well-developed.  HENT:     Head: Normocephalic and atraumatic.  Neck:     Vascular: No carotid bruit or JVD.  Cardiovascular:     Rate and Rhythm: Normal rate and regular rhythm.     Heart sounds: Normal heart sounds. No murmur heard. Pulmonary:     Effort: Pulmonary effort is normal.     Breath sounds: Normal breath sounds. No rales.  Genitourinary:    Rectum: Guaiac result negative. No anal fissure or external hemorrhoid. Normal anal tone.   Musculoskeletal:     Right lower leg: No edema.     Left lower leg: No edema.  Skin:    General: Skin is warm and dry.  Neurological:  Mental Status: He is alert and oriented to person, place, and time.  Psychiatric:        Mood and Affect: Mood normal.    Results for orders placed or performed in visit on 07/03/22  CBC  Result Value Ref Range   WBC 6.7 4.0 - 10.5 K/uL   RBC 4.87 4.22 - 5.81 Mil/uL   Platelets 269.0 150.0 - 400.0 K/uL   Hemoglobin 11.2 (L) 13.0 - 17.0 g/dL   HCT 16.1 (L) 09.6 - 04.5 %   MCV 74.2 (L) 78.0 - 100.0 fl   MCHC 30.9 30.0 - 36.0 g/dL   RDW 40.9 (H) 81.1 - 91.4 %  IFOBT POC (occult bld, rslt in office)  Result Value Ref Range   IFOBT Negative      Assessment & Plan:  Gregory Petersen is a 67 y.o. male . Hyperlipidemia, unspecified hyperlipidemia type - Plan: CT CARDIAC SCORING (SELF PAY ONLY)  -Borderline ASCVD risk were discussed, CT cardiac scoring ordered to help decide on statin.  Anemia, unspecified type - Plan: CBC, Iron, TIBC and Ferritin Panel  -Check iron testing, heme-negative stool.  Consider gastroenterology eval depending on iron levels and colonoscopy report requested.  Over-the-counter iron supplement or multivitamin with iron for now.  Repeat hemoglobin stable.  LFT elevation  -Mild elevation, decreased alcohol use recommended with repeat testing in the next 4 to 6 weeks.  PIN (prostatic intraepithelial neoplasia)  -With slight increase in PSA from previous range but still in the normal range.  Given previous PIN, recommended he consult with his urologist about the elevation in PSA and further recommendations on testing, or testing intervals.  He will contact urology, but we can place new referral if needed.  No orders of the defined types were placed in this encounter.  Patient Instructions  I do recommend meeting with urology to discuss the increase in PSA with prior borderline abnormality on biopsy. Let me know if they need a  referral.  I will order the calcium test to help decide on cholesterol meds.  Cut back on alcohol, and we can recheck liver tests in next month.  Depending on labs may need repeat evaluation with gastroenterology. I will let you know.  Iron supplement once per day or multivitamin with iron is fine for now. Return to the clinic or go to the nearest emergency room if any of your symptoms worsen or new symptoms occur. Anemia  Anemia is a condition in which there are not enough red blood cells or hemoglobin in the blood. Hemoglobin is a substance in red blood cells that carries oxygen. When you do not have enough red blood cells or hemoglobin (are anemic), your body cannot get enough oxygen, and your organs may not work properly. As a result, you may feel very tired or have other problems. What are the causes? Common causes of anemia include: Excessive bleeding. Anemia can be caused by excessive bleeding inside or outside the body, including bleeding from the intestines or from heavy menstrual periods in females. Poor nutrition. Long-lasting (chronic) kidney, thyroid, and liver disease. Bone marrow disorders, spleen problems, and blood disorders. Cancer and treatments for cancer. Human immunodeficiency virus (HIV) and acquired immunodeficiency syndrome (AIDS). Infections, medicines, and autoimmune disorders that destroy red blood cells. What are the signs or symptoms? Symptoms of this condition include: Minor weakness. Dizziness. Headache, or difficulties concentrating and sleeping. Heartbeats that feel irregular or faster than normal (palpitations). Shortness of breath, especially with exercise. Pale skin, lips, and  nails, or cold hands and feet. Upset stomach (indigestion) and nausea. Symptoms may occur suddenly or develop slowly. If your anemia is mild, you may not have symptoms. How is this diagnosed? This condition is diagnosed based on blood tests, your medical history, and a physical  exam. In some cases, a test may be needed in which cells are removed from the soft tissue inside of a bone and looked at under a microscope (bone marrow biopsy). Your health care provider may also check your stool (feces) for blood and may do more testing to look for the cause of your bleeding. Other tests may include: Imaging tests, such as a CT scan or MRI. A procedure to see inside your esophagus and stomach (endoscopy). The esophagus is the part of the body that moves food from your mouth to your stomach. A procedure to see inside your colon and rectum (colonoscopy). How is this treated? Treatment for this condition depends on the cause. If you continue to lose a lot of blood, you may need to be treated at a hospital. Treatment may include: Taking supplements of iron, vitamin B12, or folic acid. Taking a hormone medicine (erythropoietin) that can help to stimulate red blood cell growth. Receiving donated blood through an IV (blood transfusion). This may be needed if you lose a lot of blood. Making changes to your diet. Having surgery to remove your spleen. Follow these instructions at home: Take over-the-counter and prescription medicines only as told by your health care provider. Take supplements only as told by your health care provider. Follow any diet instructions that you were given by your health care provider. Keep all follow-up visits. Your health care provider will want to recheck your blood tests. Contact a health care provider if: You develop new bleeding anywhere in the body. You are very weak. Get help right away if: You are short of breath. You have pain in your abdomen or chest. You are dizzy or feel faint. You have trouble concentrating. You have bloody stools, black stools, or tarry stools. You vomit repeatedly or you vomit up blood. These symptoms may be an emergency. Get help right away. Call 911. Do not wait to see if the symptoms will go away. Do not drive  yourself to the hospital. Summary Anemia is a condition in which you do not have enough red blood cells or enough of a substance in your red blood cells that carries oxygen. Symptoms may occur suddenly or develop slowly. If your anemia is mild, you may not have symptoms. This condition is diagnosed with blood tests, a medical history, and a physical exam. Other tests may be needed. Treatment for this condition depends on the cause of the anemia. This information is not intended to replace advice given to you by your health care provider. Make sure you discuss any questions you have with your health care provider. Document Revised: 04/07/2021 Document Reviewed: 04/07/2021 Elsevier Patient Education  2024 Elsevier Inc.      Signed,   Meredith Staggers, MD [facility], [facility] 07/03/22 10:19 AM

## 2022-07-03 NOTE — Patient Instructions (Addendum)
I do recommend meeting with urology to discuss the increase in PSA with prior borderline abnormality on biopsy. Let me know if they need a referral.  I will order the calcium test to help decide on cholesterol meds.  Cut back on alcohol, and we can recheck liver tests in next month.  Depending on labs may need repeat evaluation with gastroenterology. I will let you know.  Iron supplement once per day or multivitamin with iron is fine for now. Return to the clinic or go to the nearest emergency room if any of your symptoms worsen or new symptoms occur. Anemia  Anemia is a condition in which there are not enough red blood cells or hemoglobin in the blood. Hemoglobin is a substance in red blood cells that carries oxygen. When you do not have enough red blood cells or hemoglobin (are anemic), your body cannot get enough oxygen, and your organs may not work properly. As a result, you may feel very tired or have other problems. What are the causes? Common causes of anemia include: Excessive bleeding. Anemia can be caused by excessive bleeding inside or outside the body, including bleeding from the intestines or from heavy menstrual periods in females. Poor nutrition. Long-lasting (chronic) kidney, thyroid, and liver disease. Bone marrow disorders, spleen problems, and blood disorders. Cancer and treatments for cancer. Human immunodeficiency virus (HIV) and acquired immunodeficiency syndrome (AIDS). Infections, medicines, and autoimmune disorders that destroy red blood cells. What are the signs or symptoms? Symptoms of this condition include: Minor weakness. Dizziness. Headache, or difficulties concentrating and sleeping. Heartbeats that feel irregular or faster than normal (palpitations). Shortness of breath, especially with exercise. Pale skin, lips, and nails, or cold hands and feet. Upset stomach (indigestion) and nausea. Symptoms may occur suddenly or develop slowly. If your anemia is mild,  you may not have symptoms. How is this diagnosed? This condition is diagnosed based on blood tests, your medical history, and a physical exam. In some cases, a test may be needed in which cells are removed from the soft tissue inside of a bone and looked at under a microscope (bone marrow biopsy). Your health care provider may also check your stool (feces) for blood and may do more testing to look for the cause of your bleeding. Other tests may include: Imaging tests, such as a CT scan or MRI. A procedure to see inside your esophagus and stomach (endoscopy). The esophagus is the part of the body that moves food from your mouth to your stomach. A procedure to see inside your colon and rectum (colonoscopy). How is this treated? Treatment for this condition depends on the cause. If you continue to lose a lot of blood, you may need to be treated at a hospital. Treatment may include: Taking supplements of iron, vitamin B12, or folic acid. Taking a hormone medicine (erythropoietin) that can help to stimulate red blood cell growth. Receiving donated blood through an IV (blood transfusion). This may be needed if you lose a lot of blood. Making changes to your diet. Having surgery to remove your spleen. Follow these instructions at home: Take over-the-counter and prescription medicines only as told by your health care provider. Take supplements only as told by your health care provider. Follow any diet instructions that you were given by your health care provider. Keep all follow-up visits. Your health care provider will want to recheck your blood tests. Contact a health care provider if: You develop new bleeding anywhere in the body. You are very weak.  Get help right away if: You are short of breath. You have pain in your abdomen or chest. You are dizzy or feel faint. You have trouble concentrating. You have bloody stools, black stools, or tarry stools. You vomit repeatedly or you vomit up  blood. These symptoms may be an emergency. Get help right away. Call 911. Do not wait to see if the symptoms will go away. Do not drive yourself to the hospital. Summary Anemia is a condition in which you do not have enough red blood cells or enough of a substance in your red blood cells that carries oxygen. Symptoms may occur suddenly or develop slowly. If your anemia is mild, you may not have symptoms. This condition is diagnosed with blood tests, a medical history, and a physical exam. Other tests may be needed. Treatment for this condition depends on the cause of the anemia. This information is not intended to replace advice given to you by your health care provider. Make sure you discuss any questions you have with your health care provider. Document Revised: 04/07/2021 Document Reviewed: 04/07/2021 Elsevier Patient Education  2024 [employer].

## 2022-07-04 LAB — IRON,TIBC AND FERRITIN PANEL
%SAT: 7 % (calc) — ABNORMAL LOW (ref 20–48)
Ferritin: 9 ng/mL — ABNORMAL LOW (ref 24–380)
Iron: 36 ug/dL — ABNORMAL LOW (ref 50–180)
TIBC: 489 mcg/dL (calc) — ABNORMAL HIGH (ref 250–425)

## 2022-07-07 ENCOUNTER — Encounter: Payer: Self-pay | Admitting: Gastroenterology

## 2022-07-15 ENCOUNTER — Ambulatory Visit (HOSPITAL_COMMUNITY)
Admission: RE | Admit: 2022-07-15 | Discharge: 2022-07-15 | Disposition: A | Discharge status: Home / Self Care | Payer: BC Managed Care – PPO | Source: Ambulatory Visit | Attending: Family Medicine | Admitting: Family Medicine

## 2022-07-15 DIAGNOSIS — E785 Hyperlipidemia, unspecified: Secondary | ICD-10-CM | POA: Insufficient documentation

## 2022-07-31 ENCOUNTER — Telehealth: Payer: Self-pay

## 2022-07-31 NOTE — Telephone Encounter (Signed)
Pt is aware of lab results and he has an apt with Urology in Aug .

## 2022-08-06 ENCOUNTER — Ambulatory Visit: Payer: BLUE CROSS/BLUE SHIELD | Admitting: Family Medicine

## 2022-08-07 DIAGNOSIS — N402 Nodular prostate without lower urinary tract symptoms: Secondary | ICD-10-CM | POA: Diagnosis not present

## 2022-08-07 DIAGNOSIS — R350 Frequency of micturition: Secondary | ICD-10-CM | POA: Diagnosis not present

## 2022-08-07 DIAGNOSIS — N401 Enlarged prostate with lower urinary tract symptoms: Secondary | ICD-10-CM | POA: Diagnosis not present

## 2022-08-13 ENCOUNTER — Other Ambulatory Visit: Payer: Self-pay | Admitting: Urology

## 2022-08-13 ENCOUNTER — Ambulatory Visit: Payer: BLUE CROSS/BLUE SHIELD | Admitting: Family Medicine

## 2022-08-13 DIAGNOSIS — N402 Nodular prostate without lower urinary tract symptoms: Secondary | ICD-10-CM

## 2022-08-14 ENCOUNTER — Telehealth: Payer: Self-pay | Admitting: Family Medicine

## 2022-08-14 DIAGNOSIS — R7989 Other specified abnormal findings of blood chemistry: Secondary | ICD-10-CM

## 2022-08-14 DIAGNOSIS — D649 Anemia, unspecified: Secondary | ICD-10-CM

## 2022-08-14 NOTE — Telephone Encounter (Signed)
Caller name: CARVER MURAKAMI  On DPR?: Yes  Call back number: There is no such number on file (mobile).  Provider they see: [name], MD  Reason for call:   Pt has appt with [name] on 08/19/22 but has Urology appt 09/26/22.( The earliest appt he could get)  The question he's asking is should he wait until after Urology appt to see [name]. He's preffering to cancel and come after Urology appt -advise

## 2022-08-14 NOTE — Telephone Encounter (Signed)
Should pt see urology prior to following up with you?

## 2022-08-15 NOTE — Telephone Encounter (Signed)
Please see my last note, plan for repeat liver function tests in the next month, and I would like to recheck his iron/blood counts.  That can be a lab visit but I am fine if he wants to wait to see me until after evaluation with neurology.

## 2022-08-17 NOTE — Telephone Encounter (Signed)
Re scheduled appointment and scheduled lab appointment

## 2022-08-18 ENCOUNTER — Other Ambulatory Visit: Payer: BC Managed Care – PPO

## 2022-08-18 DIAGNOSIS — R7989 Other specified abnormal findings of blood chemistry: Secondary | ICD-10-CM

## 2022-08-18 DIAGNOSIS — D649 Anemia, unspecified: Secondary | ICD-10-CM

## 2022-08-18 LAB — COMPREHENSIVE METABOLIC PANEL
ALT: 29 U/L (ref 0–53)
AST: 32 U/L (ref 0–37)
Albumin: 4.4 g/dL (ref 3.5–5.2)
Alkaline Phosphatase: 67 U/L (ref 39–117)
BUN: 12 mg/dL (ref 6–23)
CO2: 26 mEq/L (ref 19–32)
Calcium: 9.5 mg/dL (ref 8.4–10.5)
Chloride: 103 mEq/L (ref 96–112)
Creatinine, Ser: 1.13 mg/dL (ref 0.40–1.50)
GFR: 68.11 mL/min (ref >60.00)
Glucose, Bld: 101 mg/dL — ABNORMAL HIGH (ref 70–99)
Potassium: 4.4 mEq/L (ref 3.5–5.1)
Sodium: 137 mEq/L (ref 135–145)
Total Bilirubin: 0.6 mg/dL (ref 0.2–1.2)
Total Protein: 7.1 g/dL (ref 6.0–8.3)

## 2022-08-18 LAB — CBC
HCT: 35 % — ABNORMAL LOW (ref 39.0–52.0)
Hemoglobin: 10.9 g/dL — ABNORMAL LOW (ref 13.0–17.0)
MCHC: 31.1 g/dL (ref 30.0–36.0)
MCV: 74.7 fl — ABNORMAL LOW (ref 78.0–100.0)
Platelets: 256 10*3/uL (ref 150.0–400.0)
RBC: 4.69 Mil/uL (ref 4.22–5.81)
RDW: 18.5 % — ABNORMAL HIGH (ref 11.5–15.5)
WBC: 5.7 10*3/uL (ref 4.0–10.5)

## 2022-08-19 ENCOUNTER — Ambulatory Visit: Payer: BLUE CROSS/BLUE SHIELD | Admitting: Family Medicine

## 2022-08-19 LAB — IRON,TIBC AND FERRITIN PANEL
%SAT: 10 % (calc) — ABNORMAL LOW (ref 20–48)
Ferritin: 7 ng/mL — ABNORMAL LOW (ref 24–380)
Iron: 46 ug/dL — ABNORMAL LOW (ref 50–180)
TIBC: 477 mcg/dL (calc) — ABNORMAL HIGH (ref 250–425)

## 2022-08-23 ENCOUNTER — Encounter: Payer: Self-pay | Admitting: Family Medicine

## 2022-09-10 ENCOUNTER — Encounter (INDEPENDENT_AMBULATORY_CARE_PROVIDER_SITE_OTHER): Payer: Self-pay

## 2022-09-22 DIAGNOSIS — H5203 Hypermetropia, bilateral: Secondary | ICD-10-CM | POA: Diagnosis not present

## 2022-09-26 ENCOUNTER — Ambulatory Visit
Admission: RE | Admit: 2022-09-26 | Discharge: 2022-09-26 | Disposition: A | Discharge status: Home / Self Care | Payer: BC Managed Care – PPO | Source: Ambulatory Visit | Attending: Urology | Admitting: Urology

## 2022-09-26 DIAGNOSIS — N402 Nodular prostate without lower urinary tract symptoms: Secondary | ICD-10-CM

## 2022-09-26 MED ORDER — GADOPICLENOL 0.5 MMOL/ML IV SOLN
8.0000 mL | Freq: Once | INTRAVENOUS | Status: AC | PRN
  Administered 2022-09-26: 8 mL via INTRAVENOUS

## 2022-10-08 ENCOUNTER — Ambulatory Visit: Payer: BC Managed Care – PPO | Admitting: Family Medicine

## 2022-10-08 ENCOUNTER — Other Ambulatory Visit (HOSPITAL_COMMUNITY): Payer: Self-pay

## 2022-10-08 ENCOUNTER — Encounter: Payer: Self-pay | Admitting: Family Medicine

## 2022-10-08 VITALS — BP 144/80 | HR 66 | Temp 97.8°F | Ht 70.0 in | Wt 165.8 lb

## 2022-10-08 DIAGNOSIS — D649 Anemia, unspecified: Secondary | ICD-10-CM

## 2022-10-08 DIAGNOSIS — Q2579 Other congenital malformations of pulmonary artery: Secondary | ICD-10-CM | POA: Diagnosis not present

## 2022-10-08 DIAGNOSIS — E785 Hyperlipidemia, unspecified: Secondary | ICD-10-CM | POA: Diagnosis not present

## 2022-10-08 LAB — CBC
HCT: 40.5 % (ref 39.0–52.0)
Hemoglobin: 12.9 g/dL — ABNORMAL LOW (ref 13.0–17.0)
MCHC: 31.9 g/dL (ref 30.0–36.0)
MCV: 83.2 fl (ref 78.0–100.0)
Platelets: 243 10*3/uL (ref 150.0–400.0)
RBC: 4.87 Mil/uL (ref 4.22–5.81)
RDW: 25.3 % — ABNORMAL HIGH (ref 11.5–15.5)
WBC: 5.6 10*3/uL (ref 4.0–10.5)

## 2022-10-08 MED ORDER — ATORVASTATIN CALCIUM 10 MG PO TABS
10.0000 mg | ORAL_TABLET | Freq: Every day | ORAL | 0 refills | Status: DC
Start: 2022-10-08 — End: 2023-01-04
  Filled 2022-10-08: qty 90, 90d supply, fill #0

## 2022-10-08 NOTE — Progress Notes (Signed)
Subjective:  Patient ID: Gregory Petersen, male    DOB: [birth year]  Age: 67 y.o. MRN: [id number]  CC:  Chief Complaint  Patient presents with   Medical Management of Chronic Issues    HPI Gregory Petersen presents for   Follow-up chronic conditions.  Anemia Negative stool testing for blood at June visit.  Recommended following up with gastroenterology depending on colonoscopy report and option of hematology eval to discuss iron deficiency and other possible testing.  Colonoscopy in 2020 with 10-year repeat, stool testing in 5 years.  Negative recent stool test for blood as above.  Iron supplement recommended in June with recheck levels planned.11.2-10.9 on July 23.  Plan for iron supplement initially with recheck levels today. Taking iron supplement once per day, unknown mg.  Iron rich foods.  No lightheadedness/dizziness.  Dark stool with iron, no known melena/hematochezia.   Lab Results  Component Value Date   IRON 46 (L) 08/18/2022   TIBC 477 (H) 08/18/2022   FERRITIN 7 (L) 08/18/2022   Lab Results  Component Value Date   WBC 5.7 08/18/2022   HGB 10.9 (L) 08/18/2022   HCT 35.0 (L) 08/18/2022   MCV 74.7 (L) 08/18/2022   PLT 256.0 08/18/2022   Hyperlipidemia: With slight elevated ASCVD risk score of 14% at his June visit.Coronary calcium scoring June 21.  Score 35.3, 39th percentile, and notation of pulmonary artery being dilated or enlarged suggestive of possible increased pulmonary pressure.  Recommended discussing results with cardiology to decide if other testing needed and option of statin.   Has not called or met with cardiology - no regular cardiologist.    Lab Results  Component Value Date   CHOL 260 (H) 07/02/2022   HDL 69.50 07/02/2022   LDLCALC 165 (H) 07/02/2022   LDLDIRECT 171.0 07/15/2020   TRIG 127.0 07/02/2022   CHOLHDL 4 07/02/2022   Lab Results  Component Value Date   ALT 29 08/18/2022   AST 32 08/18/2022   ALKPHOS 67 08/18/2022   BILITOT 0.6  08/18/2022   Followed by urology - prostate MRI was ok recently.  Advised to repeat PSA in 6 months.  Lab Results  Component Value Date   PSA1 2.5 09/16/2016   PSA 3.80 05/28/2022   PSA 2.46 07/15/2020      History Patient Active Problem List   Diagnosis Date Noted   History of nephrectomy    Syncope 02/19/2017   Laceration of nose    Vision abnormalities    Hematuria 06/19/2011   Hemorrhoids, internal, with bleeding & occasional prolapse 06/17/2011   Past Medical History:  Diagnosis Date   Allergy    Hemorrhoids, internal, with bleeding & occasional prolapse 06/17/2011   MEDIAL MENISCUS TEAR, LEFT 12/22/2005   Qualifier: Diagnosis of  By: Gregory Stabile MD, Gregory Petersen     Nasal congestion    Renal disorder    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 Authorizing Provider  albuterol (VENTOLIN HFA) 108 (90 Base) MCG/ACT inhaler Inhale 1-2 puffs into the lungs every 6 (six) hours as needed for wheezing or shortness of breath. 05/28/22  Yes Gregory Seat,  Asencion Partridge, MD  EPINEPHrine 0.3 mg/0.3 mL IJ SOAJ injection Inject 0.3 mLs (0.3 mg total) into the muscle once. 09/07/15  Yes Kindl, Gregory Skye, MD  Melatonin 5 MG TABS Take 5 mg by mouth at bedtime.   Yes [provider]  naproxen sodium (ALEVE) 220 MG tablet Take 440 mg by mouth as needed (pain).   Yes [provider]  levofloxacin (LEVAQUIN) 750 MG tablet Take 1 tablet by mouth 1 hour prior to biopsy Patient not taking: Reported on 05/28/2022 08/29/20      Social History   Socioeconomic History   Marital status: Married    Spouse name: Not on file   Number of children: 1   Years of education: Not on file    Highest education level: Not on file  Occupational History   Not on file  Tobacco Use   Smoking status: Former    Current packs/day: 0.00    Types: Cigarettes    Quit date: 06/16/1989    Years since quitting: 33.3   Smokeless tobacco: Never  Vaping Use   Vaping status: Never Used  Substance and Sexual Activity   Alcohol use: Yes    Comment: occasional   Drug use: No   Sexual activity: Yes    Birth control/protection: None  Other Topics Concern   Not on file  Social History Narrative   Marital status: married x 1985      Children: 1 daughter (1), 2 grandchildren      Lives: with wife      Employment: Pension scheme manager; worked in [employer] x 22 years      Tobacco: none      Alcohol:  5-10 drinks per week. Craft beers      Exercise: gym 3-4 times per week; rides a bike daily.  Rock climbing.  [employer].    Social Determinants of Health   Financial Resource Strain: Not on file  Food Insecurity: Not on file  Transportation Needs: Not on file  Physical Activity: Not on file  Stress: Not on file  Social Connections: Not on file  Intimate Partner Violence: Not on file    Review of Systems   Objective:   Vitals:   10/08/22 0829  BP: (!) 144/80  Pulse: 66  Temp: 97.8 F (36.6 C)  TempSrc: Oral  SpO2: 99%  Weight: 165 lb 12.8 oz (75.2 kg)  Height: 5\' 10"  (1.778 m)     Physical Exam Vitals reviewed.  Constitutional:      Appearance: He is well-developed.  HENT:     Head: Normocephalic and atraumatic.  Neck:     Vascular: No carotid bruit or JVD.  Cardiovascular:     Rate and Rhythm: Normal rate and regular rhythm.     Heart sounds: Normal heart sounds. No murmur heard. Pulmonary:     Effort: Pulmonary effort is normal.     Breath sounds: Normal breath sounds. No rales.  Musculoskeletal:     Right lower leg: No edema.     Left lower leg: No edema.  Skin:    General: Skin is warm and dry.  Neurological:     Mental Status: He is alert and  oriented to person, place, and time.  Psychiatric:        Mood and Affect: Mood normal.        Assessment & Plan:  Gregory Petersen is a 67 y.o. male . Anemia, unspecified type - Plan: Iron, TIBC and Ferritin Panel, CBC  -  Now on supplementation.  Check CBC, repeat iron panel.  Still consider meeting with hematology to decide if further testing needed given unknown specific cause of iron deficiency.  Hyperlipidemia, unspecified hyperlipidemia type - Plan: atorvastatin (LIPITOR) 10 MG tablet, Hepatic function panel, Lipid panel  -Agrees to trial statin, potential side effects and risk discussed.  Intermittent dosing initially and advance as tolerated.  Recheck labs with lab only visit in 6 weeks from start of meds.  RTC precautions.  Pulmonary artery abnormality - Plan: Ambulatory referral to Cardiology  -Possible pulmonary hypertension, referred to cardiology, suspect echocardiogram may be next step but will defer to cardiology evaluation.  Meds ordered this encounter  Medications   atorvastatin (LIPITOR) 10 MG tablet    Sig: Take 1 tablet (10 mg total) by mouth daily.    Dispense:  90 tablet    Refill:  0   Patient Instructions  Thanks for coming in today.  I will check the blood counts and iron levels but depending on those readings I would consider meeting with hematology to look into other causes of anemia.  We can repeat labs first.  Start atorvastatin (Lipitor) once per week initially then increase by 1 dose per week as long as that is tolerated.  Recheck in approximately 6 weeks for repeat liver test and cholesterol levels, that can be a lab only visit.  I will refer you to cardiology to review the coronary calcium score exam and possible pulmonary artery abnormality.  I suspect they will want you to have an echocardiogram but that can be discussed with cardiology.  Let me know if you do not hear from them within the next 2 weeks.  Take care.     Signed,   Meredith Staggers,  MD [facility], [facility] 10/08/22 9:06 AM

## 2022-10-08 NOTE — Patient Instructions (Signed)
Thanks for coming in today.  I will check the blood counts and iron levels but depending on those readings I would consider meeting with hematology to look into other causes of anemia.  We can repeat labs first.  Start atorvastatin (Lipitor) once per week initially then increase by 1 dose per week as long as that is tolerated.  Recheck in approximately 6 weeks for repeat liver test and cholesterol levels, that can be a lab only visit.  I will refer you to cardiology to review the coronary calcium score exam and possible pulmonary artery abnormality.  I suspect they will want you to have an echocardiogram but that can be discussed with cardiology.  Let me know if you do not hear from them within the next 2 weeks.  Take care.

## 2022-10-09 LAB — IRON,TIBC AND FERRITIN PANEL
%SAT: 33 % (ref 20–48)
Ferritin: 22 ng/mL — ABNORMAL LOW (ref 24–380)
Iron: 119 ug/dL (ref 50–180)
TIBC: 364 ug/dL (ref 250–425)

## 2022-10-12 ENCOUNTER — Other Ambulatory Visit (HOSPITAL_COMMUNITY): Payer: Self-pay

## 2022-11-06 ENCOUNTER — Other Ambulatory Visit: Payer: Self-pay | Admitting: Family Medicine

## 2022-11-06 DIAGNOSIS — D509 Iron deficiency anemia, unspecified: Secondary | ICD-10-CM

## 2022-11-06 NOTE — Progress Notes (Signed)
See labs, referral to hematology.

## 2022-11-10 ENCOUNTER — Telehealth: Payer: Self-pay | Admitting: Oncology

## 2022-11-10 NOTE — Telephone Encounter (Signed)
Scheduled appointments per referral. Patient is aware of the appointment time and date as well as the address. Patient was informed to arrive 10-15 minutes prior with updated insurance information. All questions were answered.

## 2022-12-03 ENCOUNTER — Inpatient Hospital Stay: Payer: PPO | Attending: Oncology | Admitting: Oncology

## 2022-12-03 ENCOUNTER — Other Ambulatory Visit (HOSPITAL_COMMUNITY): Payer: Self-pay

## 2022-12-03 ENCOUNTER — Inpatient Hospital Stay: Payer: PPO

## 2022-12-03 ENCOUNTER — Encounter: Payer: Self-pay | Admitting: Oncology

## 2022-12-03 VITALS — BP 134/80 | HR 64 | Temp 97.8°F | Resp 17 | Wt 165.8 lb

## 2022-12-03 DIAGNOSIS — E785 Hyperlipidemia, unspecified: Secondary | ICD-10-CM | POA: Diagnosis not present

## 2022-12-03 DIAGNOSIS — D509 Iron deficiency anemia, unspecified: Secondary | ICD-10-CM

## 2022-12-03 DIAGNOSIS — E538 Deficiency of other specified B group vitamins: Secondary | ICD-10-CM | POA: Diagnosis not present

## 2022-12-03 DIAGNOSIS — Z23 Encounter for immunization: Secondary | ICD-10-CM | POA: Diagnosis not present

## 2022-12-03 DIAGNOSIS — Z299 Encounter for prophylactic measures, unspecified: Secondary | ICD-10-CM

## 2022-12-03 LAB — COMPREHENSIVE METABOLIC PANEL
ALT: 45 U/L — ABNORMAL HIGH (ref 0–44)
AST: 39 U/L (ref 15–41)
Albumin: 4.5 g/dL (ref 3.5–5.0)
Alkaline Phosphatase: 94 U/L (ref 38–126)
Anion gap: 8 (ref 5–15)
BUN: 11 mg/dL (ref 8–23)
CO2: 27 mmol/L (ref 22–32)
Calcium: 9.6 mg/dL (ref 8.9–10.3)
Chloride: 105 mmol/L (ref 98–111)
Creatinine, Ser: 1.04 mg/dL (ref 0.61–1.24)
GFR, Estimated: >60 mL/min (ref >60)
Glucose, Bld: 102 mg/dL — ABNORMAL HIGH (ref 70–99)
Potassium: 4.1 mmol/L (ref 3.5–5.1)
Sodium: 140 mmol/L (ref 135–145)
Total Bilirubin: 0.7 mg/dL (ref <1.2)
Total Protein: 7.3 g/dL (ref 6.5–8.1)

## 2022-12-03 LAB — CBC WITH DIFFERENTIAL/PLATELET
Abs Immature Granulocytes: 0.01 10*3/uL (ref 0.00–0.07)
Basophils Absolute: 0.1 10*3/uL (ref 0.0–0.1)
Basophils Relative: 1 %
Eosinophils Absolute: 0.4 10*3/uL (ref 0.0–0.5)
Eosinophils Relative: 6 %
HCT: 41.8 % (ref 39.0–52.0)
Hemoglobin: 14.5 g/dL (ref 13.0–17.0)
Immature Granulocytes: 0 %
Lymphocytes Relative: 22 %
Lymphs Abs: 1.3 10*3/uL (ref 0.7–4.0)
MCH: 29.7 pg (ref 26.0–34.0)
MCHC: 34.7 g/dL (ref 30.0–36.0)
MCV: 85.5 fL (ref 80.0–100.0)
Monocytes Absolute: 0.5 10*3/uL (ref 0.1–1.0)
Monocytes Relative: 8 %
Neutro Abs: 3.7 10*3/uL (ref 1.7–7.7)
Neutrophils Relative %: 63 %
Platelets: 192 10*3/uL (ref 150–400)
RBC: 4.89 MIL/uL (ref 4.22–5.81)
RDW: 16.2 % — ABNORMAL HIGH (ref 11.5–15.5)
WBC: 5.9 10*3/uL (ref 4.0–10.5)
nRBC: 0 % (ref 0.0–0.2)

## 2022-12-03 LAB — IRON AND IRON BINDING CAPACITY (CC-WL,HP ONLY)
Iron: 160 ug/dL (ref 45–182)
Saturation Ratios: 44 % — ABNORMAL HIGH (ref 17.9–39.5)
TIBC: 363 ug/dL (ref 250–450)
UIBC: 203 ug/dL (ref 117–376)

## 2022-12-03 LAB — LACTATE DEHYDROGENASE: LDH: 173 U/L (ref 98–192)

## 2022-12-03 LAB — FOLATE: Folate: 6.8 ng/mL (ref >5.9)

## 2022-12-03 LAB — FERRITIN: Ferritin: 46 ng/mL (ref 24–336)

## 2022-12-03 LAB — VITAMIN B12: Vitamin B-12: 157 pg/mL — ABNORMAL LOW (ref 180–914)

## 2022-12-03 MED ORDER — VITAMIN B-12 1000 MCG PO TABS
1000.0000 ug | ORAL_TABLET | Freq: Every day | ORAL | 3 refills | Status: DC
  Filled 2022-12-03: qty 90, 90d supply, fill #0
  Filled 2023-02-28: qty 90, 90d supply, fill #1
  Filled 2023-06-24: qty 90, 90d supply, fill #2
  Filled 2023-09-23 – 2023-09-29 (×2): qty 90, 90d supply, fill #3

## 2022-12-03 MED ORDER — INFLUENZA VAC A&B SURF ANT ADJ 0.5 ML IM SUSY
0.5000 mL | PREFILLED_SYRINGE | Freq: Once | INTRAMUSCULAR | Status: AC
  Administered 2022-12-03: 0.5 mL via INTRAMUSCULAR
  Filled 2022-12-03: qty 0.5

## 2022-12-03 NOTE — Assessment & Plan Note (Signed)
On atorvastatin for cholesterol management. -Continue atorvastatin as prescribed.

## 2022-12-03 NOTE — Assessment & Plan Note (Addendum)
-  Recent onset anemia with improvement on oral iron supplementation. No overt signs of bleeding. Possible microscopic blood loss or absorption issue. Hemorrhoidal bleeding reported with physical activity. -Labs today revealed hemoglobin of 14.5, hematocrit 41.8, MCV 85.5, essentially normal parameters.  White count 5900 with normal differential.  Platelet count normal at 192,000.  CMP grossly unremarkable.  Iron studies are within normal limits.  Folate normal.  Ferritin pending.  Vitamin B12 did come back low at 157. -Continue oral iron supplementation with vitamin C.  -I do not see need for extensive workup at this time. -Consider referral to GI for upper endoscopy if anemia or iron deficiency persists.

## 2022-12-03 NOTE — Patient Instructions (Signed)

## 2022-12-03 NOTE — Progress Notes (Signed)
[facility]  HEMATOLOGY CLINIC CONSULTATION NOTE    PATIENT NAME: Gregory Petersen   MR#: [id number] DOB: 06/30/1955  DATE OF SERVICE: 12/03/2022  Patient Care Team: [name], MD as PCP - General (Family Medicine) Carman Ching, MD (Inactive) as Consulting Physician (Gastroenterology)  REASON FOR CONSULTATION/ CHIEF COMPLAINT:  Evaluation of anemia.  HISTORY OF PRESENT ILLNESS:  Gregory Petersen is a 67 y.o. gentleman with a past medical history of dyslipidemia, kidney donor, internal hemorrhoids, was referred to our service for evaluation of iron deficiency anemia.    Discussed the use of AI scribe software for clinical note transcription with the patient, who gave verbal consent to proceed.   On his routine follow-up with his PCP on 10/08/2022, hemoglobin was 12.9, hematocrit 40.5, MCV 83.2.  White count, platelet count were within normal limits.  Previously also he has had iron deficiency anemia with hemoglobin as low as 10.9 in July 2024.  Previously his iron studies indicated severe iron deficiency with ferritin of 7, iron saturation decreased at 10%.  He was previously treated with oral iron supplements.  Since anemia persisted, referral was sent to [place] for consideration of IV iron.  Colonoscopy in July 2020. Mild diverticulosis noted. No polyps or other abnormalities. No biopsies performed. Plan for 10-year repeat, stool testing in 5 years. Negative recent stool test for blood.   The patient has been taking over-the-counter iron supplements, which have improved his hemoglobin levels and iron stores. However, the cause of the patient's iron deficiency remains unknown. The patient denies any noticeable blood loss, such as blood in stools or nosebleeds. He underwent a colonoscopy in 2020, which did not reveal any abnormalities. The patient does not follow a special diet and denies symptoms of acid reflux or heartburn. He occasionally takes over-the-counter pain  medications, such as Aleve, for muscle soreness after working out. The patient also reports occasional bleeding from hemorrhoids, particularly after bike riding. He donated a kidney to his half-brother in 2021. The patient is otherwise active, engaging in regular physical activities such as bike riding and kayaking.  He denies recent chest pain on exertion, shortness of breath on minimal exertion, pre-syncopal episodes, or palpitations.  He had not noticed any recent bleeding such as epistaxis, hematuria or hematochezia  He had no prior history or diagnosis of cancer. His age appropriate screening programs are up-to-date.   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 His smoking use included cigarettes. He has never used smokeless tobacco. He reports current alcohol use. He reports that he does not use drugs. Social History   Socioeconomic History  Marital status: Married    Spouse name: Not on file   Number of children: 1   Years of education: Not on file   Highest education level: Not on file  Occupational History   Not on file  Tobacco Use   Smoking status: Former    Current packs/day: 0.00    Types: Cigarettes    Quit date: 06/16/1989    Years since quitting: 33.4   Smokeless tobacco:  Never  Vaping Use   Vaping status: Never Used  Substance and Sexual Activity   Alcohol use: Yes    Comment: occasional   Drug use: No   Sexual activity: Yes    Birth control/protection: None  Other Topics Concern   Not on file  Social History Narrative   Marital status: married x 1985      Children: 1 daughter (49), 2 grandchildren      Lives: with wife      Employment: Pension scheme manager; worked in [employer] x 22 years      Tobacco: none      Alcohol:  5-10 drinks per week. Craft beers      Exercise: gym 3-4 times per week; rides a bike daily.  Rock climbing.  [employer].    Social Determinants of Health   Financial Resource Strain: Not on file  Food Insecurity: No Food Insecurity (12/03/2022)   Hunger Vital Sign    Worried About Running Out of Food in the Last Year: Never true    Ran Out of Food in the Last Year: Never true  Transportation Needs: No Transportation Needs (12/03/2022)   PRAPARE - Administrator, Civil Service (Medical): No    Lack of Transportation (Non-Medical): No  Physical Activity: Not on file  Stress: Not on file  Social Connections: Not on file  Intimate Partner Violence: Not At Risk (12/03/2022)   Humiliation, Afraid, Rape, and Kick questionnaire    Fear of Current or Ex-Partner: No    Emotionally Abused: No    Physically Abused: No    Sexually Abused: No    FAMILY HISTORY: Family History  Problem Relation Age of Onset   Heart disease Mother    Hypertension Mother    Heart disease Father 68       AMI as cause of death   Diabetes Father    COPD Brother    Tuberous sclerosis Brother     ALLERGIES:  He is allergic to sulfa antibiotics.  MEDICATIONS:  Current Outpatient Medications  Medication Sig Dispense Refill   atorvastatin (LIPITOR) 10 MG tablet Take 1 tablet (10 mg total) by mouth daily. 90 tablet 0   cyanocobalamin (VITAMIN B12) 1000 MCG tablet Take 1 tablet (1,000 mcg total) by mouth daily. 90 tablet 3    EPINEPHrine 0.3 mg/0.3 mL IJ SOAJ injection Inject 0.3 mLs (0.3 mg total) into the muscle once. 1 Device 1   Melatonin 5 MG TABS Take 5 mg by mouth at bedtime.     naproxen sodium (ALEVE) 220 MG tablet Take 440 mg by mouth as needed (pain).     No current facility-administered medications for this visit.    REVIEW OF SYSTEMS:    Review of Systems - Oncology  All other pertinent systems were reviewed and were negative except as mentioned above.  PHYSICAL EXAMINATION:  ECOG PERFORMANCE STATUS: 1 - Symptomatic but completely ambulatory  Vitals:   12/03/22 0859  BP: 134/80  Pulse: 64  Resp: 17  Temp: 97.8 F (  36.6 C)  SpO2: 100%   Filed Weights   12/03/22 0859  Weight: 165 lb 12.8 oz (75.2 kg)    Physical Exam Constitutional:      General: He is not in acute distress.    Appearance: Normal appearance.  HENT:     Head: Normocephalic and atraumatic.  Eyes:     General: No scleral icterus.    Conjunctiva/sclera: Conjunctivae normal.  Cardiovascular:     Rate and Rhythm: Normal rate and regular rhythm.     Heart sounds: Normal heart sounds.  Pulmonary:     Effort: Pulmonary effort is normal.     Breath sounds: Normal breath sounds.  Abdominal:     General: There is no distension.  Musculoskeletal:     Right lower leg: No edema.     Left lower leg: No edema.  Neurological:     General: No focal deficit present.     Mental Status: He is alert and oriented to person, place, and time.  Psychiatric:        Mood and Affect: Mood normal.        Behavior: Behavior normal.        Thought Content: Thought content normal.     LABORATORY DATA:   I have reviewed the data as listed.  Results for orders placed or performed in visit on 12/03/22  Folate  Result Value Ref Range   Folate 6.8 >5.9 ng/mL  Vitamin B12  Result Value Ref Range   Vitamin B-12 157 (L) 180 - 914 pg/mL  Lactate dehydrogenase  Result Value Ref Range   LDH 173 98 - 192 U/L  Iron and Iron Binding  Capacity ([facility] only)  Result Value Ref Range   Iron 160 45 - 182 ug/dL   TIBC 161 096 - 045 ug/dL   Saturation Ratios 44 (H) 17.9 - 39.5 %   UIBC 203 117 - 376 ug/dL  Comprehensive metabolic panel  Result Value Ref Range   Sodium 140 135 - 145 mmol/L   Potassium 4.1 3.5 - 5.1 mmol/L   Chloride 105 98 - 111 mmol/L   CO2 27 22 - 32 mmol/L   Glucose, Bld 102 (H) 70 - 99 mg/dL   BUN 11 8 - 23 mg/dL   Creatinine, Ser 4.09 0.61 - 1.24 mg/dL   Calcium 9.6 8.9 - 81.1 mg/dL   Total Protein 7.3 6.5 - 8.1 g/dL   Albumin 4.5 3.5 - 5.0 g/dL   AST 39 15 - 41 U/L   ALT 45 (H) 0 - 44 U/L   Alkaline Phosphatase 94 38 - 126 U/L   Total Bilirubin 0.7 <1.2 mg/dL   GFR, Estimated >91 >47 mL/min   Anion gap 8 5 - 15  CBC with Differential/Platelet  Result Value Ref Range   WBC 5.9 4.0 - 10.5 K/uL   RBC 4.89 4.22 - 5.81 MIL/uL   Hemoglobin 14.5 13.0 - 17.0 g/dL   HCT 82.9 56.2 - 13.0 %   MCV 85.5 80.0 - 100.0 fL   MCH 29.7 26.0 - 34.0 pg   MCHC 34.7 30.0 - 36.0 g/dL   RDW 86.5 (H) 78.4 - 69.6 %   Platelets 192 150 - 400 K/uL   nRBC 0.0 0.0 - 0.2 %   Neutrophils Relative % 63 %   Neutro Abs 3.7 1.7 - 7.7 K/uL   Lymphocytes Relative 22 %   Lymphs Abs 1.3 0.7 - 4.0 K/uL   Monocytes Relative 8 %   Monocytes  Absolute 0.5 0.1 - 1.0 K/uL   Eosinophils Relative 6 %   Eosinophils Absolute 0.4 0.0 - 0.5 K/uL   Basophils Relative 1 %   Basophils Absolute 0.1 0.0 - 0.1 K/uL   Immature Granulocytes 0 %   Abs Immature Granulocytes 0.01 0.00 - 0.07 K/uL     RADIOGRAPHIC STUDIES:  No pertinent imaging studies available to review.   ASSESSMENT & PLAN:   67 y.o. gentleman with a past medical history of dyslipidemia, kidney donor, internal hemorrhoids, was referred to our service for evaluation of iron deficiency anemia.   Microcytic anemia -Recent onset anemia with improvement on oral iron supplementation. No overt signs of bleeding. Possible microscopic blood loss or absorption issue.  Hemorrhoidal bleeding reported with physical activity. -Labs today revealed hemoglobin of 14.5, hematocrit 41.8, MCV 85.5, essentially normal parameters.  White count 5900 with normal differential.  Platelet count normal at 192,000.  CMP grossly unremarkable.  Iron studies are within normal limits.  Folate normal.  Ferritin pending.  Vitamin B12 did come back low at 157. -Continue oral iron supplementation with vitamin C.  -I do not see need for extensive workup at this time. -Consider referral to GI for upper endoscopy if anemia or iron deficiency persists.  Dyslipidemia On atorvastatin for cholesterol management. -Continue atorvastatin as prescribed.  Vitamin B12 deficiency His vitamin B12 came back low at 157.  He was started on 1000 mcg of vitamin B12 orally daily.  We will recheck this on return visit.   Since the cause of anemia seems to be obvious from iron deficiency, I am not pursuing extensive workup at this time.  If inadequate response to iron supplementation is noted, we will pursue workup to rule out other etiologies.  RTC in 3 months for labs, follow-up.  Orders Placed This Encounter  Procedures   CBC with Differential/Platelet    Petersen Status:   Future    Number of Occurrences:   1    Petersen Expiration Date:   12/03/2023   Comprehensive metabolic panel    Petersen Status:   Future    Number of Occurrences:   1    Petersen Expiration Date:   12/03/2023   Ferritin    Petersen Status:   Future    Number of Occurrences:   1    Petersen Expiration Date:   12/03/2023   Iron and Iron Binding Capacity ([facility] only)    Petersen Status:   Future    Number of Occurrences:   1    Petersen Expiration Date:   12/03/2023   Lactate dehydrogenase    Petersen Status:   Future    Number of Occurrences:   1    Petersen Expiration Date:   12/03/2023   Haptoglobin    Petersen Status:   Future    Number of Occurrences:   1    Petersen Expiration Date:   12/03/2023   Vitamin B12     Petersen Status:   Future    Number of Occurrences:   1    Petersen Expiration Date:   12/03/2023   Folate    Petersen Status:   Future    Number of Occurrences:   1    Petersen Expiration Date:   12/03/2023   Gliadin Antibodies, Serum   CBC with Differential/Platelet    Petersen Status:   Future    Petersen Expiration Date:   12/03/2023   Comprehensive metabolic panel    Petersen Status:   Future  Petersen Expiration Date:   12/03/2023   Ferritin    Petersen Status:   Future    Petersen Expiration Date:   12/03/2023   Iron and Iron Binding Capacity ([facility] only)    Petersen Status:   Future    Petersen Expiration Date:   12/03/2023   Vitamin B12    Petersen Status:   Future    Petersen Expiration Date:   12/03/2023    The total time spent in the appointment was 45 minutes encounter with patients including review of chart and various tests results, discussions about plan of care and coordination of care plan.  I reviewed lab results and outside records for this visit and discussed relevant results with the patient. Diagnosis, plan of care and treatment options were also discussed in detail with the patient. Opportunity provided to ask questions and answers provided to his apparent satisfaction. Provided instructions to call our clinic with any problems, questions or concerns prior to return visit. I recommended to continue follow-up with PCP and sub-specialists. He verbalized understanding and agreed with the plan. No barriers to learning was detected.   Future Appointments  Date Time Provider Department Center  12/15/2022  3:00 PM Wendall Stade, MD [facility]  03/05/2023 10:30 AM [facility] None  03/05/2023 11:00 AM Kelley Polinsky, Archie Patten, MD [facility] None  04/08/2023  8:40 AM [name], MD [facility]  06/03/2023  8:00 AM [name], MD [facility]     Meryl Crutch, MD [facility] [address] [place] [address] Dept: [phone] Dept Fax: [phone]  12/03/2022 1:44 PM   This document was completed utilizing speech recognition software. Grammatical errors, random word insertions, pronoun errors, and incomplete sentences are an occasional consequence of this system due to software limitations, ambient noise, and hardware issues. Any formal questions or concerns about the content, text or information contained within the body of this dictation should be directly addressed to the provider for clarification.

## 2022-12-03 NOTE — Assessment & Plan Note (Signed)
His vitamin B12 came back low at 157.  He was started on 1000 mcg of vitamin B12 orally daily.  We will recheck this on return visit.

## 2022-12-04 LAB — GLIADIN ANTIBODIES, SERUM
Antigliadin Abs, IgA: 25 U — ABNORMAL HIGH (ref 0–19)
Gliadin IgG: 31 U — ABNORMAL HIGH (ref 0–19)

## 2022-12-05 LAB — HAPTOGLOBIN: Haptoglobin: 135 mg/dL (ref 32–363)

## 2022-12-08 NOTE — Progress Notes (Signed)
CARDIOLOGY CONSULT NOTE       Patient ID: Gregory Petersen MRN: [id number] DOB/AGE: 09/07/1955 67 y.o.  Referring Physician: [name] Primary Physician: [name], MD Primary Cardiologist: New Reason for Consultation: Dilated main PA   HPI:  67 y.o. 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 Reviewed study. Calcium score 35.3 isolated to LAD This is only 39 th percentile for age/sex. 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  No dyspnea wheezing, OSA, smoking or any other lung dx   ROS All other systems reviewed and negative except as noted above  Past Medical History:  Diagnosis Date   Allergy    Anemia    Hematuria    Hemorrhoids    Hemorrhoids, internal, with bleeding & occasional prolapse 06/17/2011   History of nephrectomy    Laceration of nose    Medial meniscus tear    MEDIAL MENISCUS TEAR, LEFT 12/22/2005   Qualifier: Diagnosis of  By: Mannie Stabile MD, JOSEPH     Nasal congestion    Renal disorder    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   Syncope    Vision abnormalities     Family History  Problem Relation Age of Onset   Heart disease Mother    Hypertension Mother    Heart disease Father 59       AMI as cause of death   Diabetes Father    COPD Brother    Tuberous sclerosis Brother     Social History   Socioeconomic History   Marital status: Married    Spouse name: Not on file   Number of children: 1   Years of education: Not on file   Highest education level: Not on file   Occupational History   Not on file  Tobacco Use   Smoking status: Former    Current packs/day: 0.00    Types: Cigarettes    Quit date: 06/16/1989    Years since quitting: 33.5   Smokeless tobacco: Never  Vaping Use   Vaping status: Never Used  Substance and Sexual Activity   Alcohol use: Yes    Comment: occasional   Drug use: No   Sexual activity: Yes    Birth control/protection: None  Other Topics Concern   Not on file  Social History Narrative   Marital status: married x 1985      Children: 1 daughter (56), 2 grandchildren      Lives: with wife      Employment: Pension scheme manager; worked in [employer] x 22 years      Tobacco: none      Alcohol:  5-10 drinks per week. Craft beers      Exercise: gym 3-4 times per week; rides a bike daily.  Rock climbing.  [employer].    Social Determinants of Health   Financial Resource Strain: Not on file  Food Insecurity: No Food Insecurity (12/03/2022)   Hunger Vital Sign  Worried About Programme researcher, broadcasting/film/video in the Last Year: Never true    Ran Out of Food in the Last Year: Never true  Transportation Needs: No Transportation Needs (12/03/2022)   PRAPARE - Administrator, Civil Service (Medical): No    Lack of Transportation (Non-Medical): No  Physical Activity: Not on file  Stress: Not on file  Social Connections: Not on file  Intimate Partner Violence: Not At Risk (12/03/2022)   Humiliation, Afraid, Rape, and Kick questionnaire    Fear of Current or Ex-Partner: No    Emotionally Abused: No    Physically Abused: No    Sexually Abused: No    Past Surgical History:  Procedure Laterality Date   HEMORRHOID SURGERY     KIDNEY DONATION  10/08/1999   TONSILLECTOMY     patient does not remember exact date - was in childhood      Current Outpatient Medications:    atorvastatin (LIPITOR) 10 MG tablet, Take 1 tablet (10 mg total) by mouth daily., Disp: 90 tablet, Rfl: 0   cyanocobalamin (VITAMIN B12) 1000 MCG  tablet, Take 1 tablet (1,000 mcg total) by mouth daily., Disp: 90 tablet, Rfl: 3   EPINEPHrine 0.3 mg/0.3 mL IJ SOAJ injection, Inject 0.3 mLs (0.3 mg total) into the muscle once., Disp: 1 Device, Rfl: 1   Melatonin 5 MG TABS, Take 5 mg by mouth at bedtime., Disp: , Rfl:    naproxen sodium (ALEVE) 220 MG tablet, Take 440 mg by mouth as needed (pain)., Disp: , Rfl:     Physical Exam: There were no vitals taken for this visit.    Affect appropriate Healthy:  appears stated age HEENT: normal Neck supple with no adenopathy JVP normal no bruits no thyromegaly Lungs clear with no wheezing and good diaphragmatic motion Heart:  S1/S2 1/6 SEM  murmur, no rub, gallop or click PMI normal Abdomen: benighn, BS positve, no tenderness, no AAA no bruit.  No HSM or HJR prior donor nephrectomy  Distal pulses intact with no bruits No edema Neuro non-focal Skin warm and dry No muscular weakness   Labs:   Lab Results  Component Value Date   WBC 5.9 12/03/2022   HGB 14.5 12/03/2022   HCT 41.8 12/03/2022   MCV 85.5 12/03/2022   PLT 192 12/03/2022    No results for input(s): "NA", "K", "CL", "CO2", "BUN", "CREATININE", "CALCIUM", "PROT", "BILITOT", "ALKPHOS", "ALT", "AST", "GLUCOSE" in the last 168 hours.  Invalid input(s): "LABALBU"  Lab Results  Component Value Date   TROPONINI <0.03 02/20/2017    Lab Results  Component Value Date   CHOL 260 (H) 07/02/2022   CHOL 224 (H) 05/28/2022   CHOL 258 (H) 07/15/2020   Lab Results  Component Value Date   HDL 69.50 07/02/2022   HDL 61.20 05/28/2022   HDL 58.30 07/15/2020   Lab Results  Component Value Date   LDLCALC 165 (H) 07/02/2022   LDLCALC 127 (H) 05/28/2022   LDLCALC 125 (H) 02/19/2017   Lab Results  Component Value Date   TRIG 127.0 07/02/2022   TRIG 180.0 (H) 05/28/2022   TRIG 318.0 (H) 07/15/2020   Lab Results  Component Value Date   CHOLHDL 4 07/02/2022   CHOLHDL 4 05/28/2022   CHOLHDL 4 07/15/2020   Lab Results   Component Value Date   LDLDIRECT 171.0 07/15/2020      Radiology: No results found.  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

## 2022-12-15 ENCOUNTER — Encounter: Payer: Self-pay | Admitting: Cardiovascular Disease

## 2022-12-15 ENCOUNTER — Ambulatory Visit: Payer: PPO | Attending: Cardiovascular Disease | Admitting: Cardiovascular Disease

## 2022-12-15 VITALS — BP 156/86 | HR 78 | Ht 70.0 in | Wt 162.6 lb

## 2022-12-15 DIAGNOSIS — E785 Hyperlipidemia, unspecified: Secondary | ICD-10-CM

## 2022-12-15 DIAGNOSIS — Q2579 Other congenital malformations of pulmonary artery: Secondary | ICD-10-CM | POA: Diagnosis not present

## 2022-12-15 NOTE — Patient Instructions (Signed)
 Medication Instructions:  Your physician recommends that you continue on your current medications as directed. Please refer to the Current Medication list given to you today.  *If you need a refill on your cardiac medications before your next appointment, please call your pharmacy*  Lab Work: If you have labs (blood work) drawn today and your tests are completely normal, you will receive your results only by: MyChart Message (if you have MyChart) OR A paper copy in the mail If you have any lab test that is abnormal or we need to change your treatment, we will call you to review the results.  Testing/Procedures: Your physician has requested that you have an echocardiogram. Echocardiography is a painless test that uses sound waves to create images of your heart. It provides your doctor with information about the size and shape of your heart and how well your heart's chambers and valves are working. This procedure takes approximately one hour. There are no restrictions for this procedure. Please do NOT wear cologne, perfume, aftershave, or lotions (deodorant is allowed). Please arrive 15 minutes prior to your appointment time.  Please note: We ask at that you not bring children with you during ultrasound (echo/ vascular) testing. Due to room size and safety concerns, children are not allowed in the ultrasound rooms during exams. Our front office staff cannot provide observation of children in our lobby area while testing is being conducted. An adult accompanying a patient to their appointment will only be allowed in the ultrasound room at the discretion of the ultrasound technician under special circumstances. We apologize for any inconvenience. Follow-Up: At [facility], you and your health needs are our priority.  As part of our continuing mission to provide you with exceptional heart care, we have created designated Provider Care Teams.  These Care Teams include your primary Cardiologist  (physician) and Advanced Practice Providers (APPs -  Physician Assistants and Nurse Practitioners) who all work together to provide you with the care you need, when you need it.  We recommend signing up for the patient portal called "MyChart".  Sign up information is provided on this After Visit Summary.  MyChart is used to connect with patients for Virtual Visits (Telemedicine).  Patients are able to view lab/test results, encounter notes, upcoming appointments, etc.  Non-urgent messages can be sent to your provider as well.   To learn more about what you can do with MyChart, go to [email or web address].    Your next appointment:   As needed   Provider:   Charlton Haws, MD

## 2023-01-04 ENCOUNTER — Other Ambulatory Visit: Payer: Self-pay | Admitting: Family Medicine

## 2023-01-04 ENCOUNTER — Other Ambulatory Visit (HOSPITAL_COMMUNITY): Payer: Self-pay

## 2023-01-04 DIAGNOSIS — E785 Hyperlipidemia, unspecified: Secondary | ICD-10-CM

## 2023-01-04 MED ORDER — ATORVASTATIN CALCIUM 10 MG PO TABS
10.0000 mg | ORAL_TABLET | Freq: Every day | ORAL | 0 refills | Status: DC
Start: 2023-01-04 — End: 2023-04-04
  Filled 2023-01-04: qty 90, 90d supply, fill #0

## 2023-01-08 ENCOUNTER — Other Ambulatory Visit (HOSPITAL_COMMUNITY): Payer: Self-pay

## 2023-01-29 ENCOUNTER — Ambulatory Visit (HOSPITAL_COMMUNITY): Payer: PPO | Attending: Cardiovascular Disease

## 2023-01-29 DIAGNOSIS — Q2579 Other congenital malformations of pulmonary artery: Secondary | ICD-10-CM | POA: Diagnosis not present

## 2023-01-29 DIAGNOSIS — E785 Hyperlipidemia, unspecified: Secondary | ICD-10-CM | POA: Diagnosis not present

## 2023-01-29 LAB — ECHOCARDIOGRAM COMPLETE BUBBLE STUDY
Area-P 1/2: 3.58 cm2
S' Lateral: 2.6 cm

## 2023-03-01 ENCOUNTER — Other Ambulatory Visit (HOSPITAL_COMMUNITY): Payer: Self-pay

## 2023-03-05 ENCOUNTER — Encounter: Payer: Self-pay | Admitting: Oncology

## 2023-03-05 ENCOUNTER — Inpatient Hospital Stay: Payer: PPO

## 2023-03-05 ENCOUNTER — Inpatient Hospital Stay: Payer: PPO | Attending: Oncology | Admitting: Oncology

## 2023-03-05 VITALS — BP 141/103 | HR 79 | Temp 98.2°F | Resp 18 | Wt 165.3 lb

## 2023-03-05 DIAGNOSIS — E538 Deficiency of other specified B group vitamins: Secondary | ICD-10-CM

## 2023-03-05 DIAGNOSIS — D509 Iron deficiency anemia, unspecified: Secondary | ICD-10-CM | POA: Diagnosis not present

## 2023-03-05 LAB — COMPREHENSIVE METABOLIC PANEL
ALT: 65 U/L — ABNORMAL HIGH (ref 0–44)
AST: 55 U/L — ABNORMAL HIGH (ref 15–41)
Albumin: 4.7 g/dL (ref 3.5–5.0)
Alkaline Phosphatase: 101 U/L (ref 38–126)
Anion gap: 8 (ref 5–15)
BUN: 9 mg/dL (ref 8–23)
CO2: 26 mmol/L (ref 22–32)
Calcium: 9.5 mg/dL (ref 8.9–10.3)
Chloride: 107 mmol/L (ref 98–111)
Creatinine, Ser: 0.93 mg/dL (ref 0.61–1.24)
GFR, Estimated: >60 mL/min (ref >60)
Glucose, Bld: 88 mg/dL (ref 70–99)
Potassium: 4 mmol/L (ref 3.5–5.1)
Sodium: 141 mmol/L (ref 135–145)
Total Bilirubin: 0.5 mg/dL (ref 0.0–1.2)
Total Protein: 7.4 g/dL (ref 6.5–8.1)

## 2023-03-05 LAB — CBC WITH DIFFERENTIAL/PLATELET
Abs Immature Granulocytes: 0.02 10*3/uL (ref 0.00–0.07)
Basophils Absolute: 0.1 10*3/uL (ref 0.0–0.1)
Basophils Relative: 2 %
Eosinophils Absolute: 0.2 10*3/uL (ref 0.0–0.5)
Eosinophils Relative: 3 %
HCT: 42.2 % (ref 39.0–52.0)
Hemoglobin: 14.7 g/dL (ref 13.0–17.0)
Immature Granulocytes: 0 %
Lymphocytes Relative: 24 %
Lymphs Abs: 1.5 10*3/uL (ref 0.7–4.0)
MCH: 30.9 pg (ref 26.0–34.0)
MCHC: 34.8 g/dL (ref 30.0–36.0)
MCV: 88.8 fL (ref 80.0–100.0)
Monocytes Absolute: 0.5 10*3/uL (ref 0.1–1.0)
Monocytes Relative: 8 %
Neutro Abs: 3.8 10*3/uL (ref 1.7–7.7)
Neutrophils Relative %: 63 %
Platelets: 206 10*3/uL (ref 150–400)
RBC: 4.75 MIL/uL (ref 4.22–5.81)
RDW: 13.6 % (ref 11.5–15.5)
WBC: 6.1 10*3/uL (ref 4.0–10.5)
nRBC: 0 % (ref 0.0–0.2)

## 2023-03-05 LAB — IRON AND IRON BINDING CAPACITY (CC-WL,HP ONLY)
Iron: 136 ug/dL (ref 45–182)
Saturation Ratios: 39 % (ref 17.9–39.5)
TIBC: 353 ug/dL (ref 250–450)
UIBC: 217 ug/dL (ref 117–376)

## 2023-03-05 LAB — FERRITIN: Ferritin: 77 ng/mL (ref 24–336)

## 2023-03-05 LAB — VITAMIN B12: Vitamin B-12: 564 pg/mL (ref 180–914)

## 2023-03-05 NOTE — Assessment & Plan Note (Signed)
 His vitamin B12 came back low at 157 on his initial consultation with us  on 12/03/2022.  He was started on 1000 mcg of vitamin B12 orally daily.   Repeat B12 was normal today at 564.  Continue oral B12 supplements.

## 2023-03-05 NOTE — Assessment & Plan Note (Signed)
-  Recent onset anemia with improvement on oral iron supplementation. No overt signs of bleeding. Possible microscopic blood loss or absorption issue. Hemorrhoidal bleeding reported with physical activity.  On his initial consultation with us  on 12/03/2022, labs revealed hemoglobin of 14.5, hematocrit 41.8, MCV 85.5, essentially normal parameters.  White count 5900 with normal differential.  Platelet count normal at 192,000.  CMP grossly unremarkable.  Iron studies and ferritin were within normal limits.  Folate normal. Vitamin B12 did come back low at 157.  Continue oral iron supplementation with vitamin C.   He was started on vitamin B12 1000 mcg orally daily in November 2024.  On his repeat visit with us  on 03/05/2023, labs continued to be unremarkable.  Hemoglobin was 14.7, MCV 88.  White count and platelet count were normal.  Iron studies, B12 are normal at this time.  I do not see need for extensive workup at this time.  Since all his blood counts are within normal limits currently, he will be discharged from our office for continued follow-up with his PCP.  Patient agreeable with this plan.  Please reconsult us  as necessary.   Consider referral to GI for upper endoscopy if anemia or iron deficiency recurs.

## 2023-03-05 NOTE — Progress Notes (Signed)
 [facility]  HEMATOLOGY CLINIC PROGRESS NOTE  PATIENT NAME: Gregory Petersen   MR#: [id number] DOB: 05/25/1955  Patient Care Team: Levora Reyes SAUNDERS, MD as PCP - General (Family Medicine) Delford Maude BROCKS, MD as PCP - Cardiology (Cardiology) Celestia Agent, MD (Inactive) as Consulting Physician (Gastroenterology)  Date of visit: 03/05/2023   ASSESSMENT & PLAN:   Gregory Petersen is a 68 y.o. gentleman with a past medical history of dyslipidemia, kidney donor, internal hemorrhoids, was referred to our service in November 2024 for evaluation of iron deficiency anemia.    Microcytic anemia -Recent onset anemia with improvement on oral iron supplementation. No overt signs of bleeding. Possible microscopic blood loss or absorption issue. Hemorrhoidal bleeding reported with physical activity.  On his initial consultation with us  on 12/03/2022, labs revealed hemoglobin of 14.5, hematocrit 41.8, MCV 85.5, essentially normal parameters.  White count 5900 with normal differential.  Platelet count normal at 192,000.  CMP grossly unremarkable.  Iron studies and ferritin were within normal limits.  Folate normal. Vitamin B12 did come back low at 157.  Continue oral iron supplementation with vitamin C.   He was started on vitamin B12 1000 mcg orally daily in November 2024.  On his repeat visit with us  on 03/05/2023, labs continued to be unremarkable.  Hemoglobin was 14.7, MCV 88.  White count and platelet count were normal.  Iron studies, B12 are normal at this time.  I do not see need for extensive workup at this time.  Since all his blood counts are within normal limits currently, he will be discharged from our office for continued follow-up with his PCP.  Patient agreeable with this plan.  Please reconsult us  as necessary.   Consider referral to GI for upper endoscopy if anemia or iron deficiency recurs.   Vitamin B12 deficiency His vitamin B12 came back low at 157 on his initial  consultation with us  on 12/03/2022.  He was started on 1000 mcg of vitamin B12 orally daily.   Repeat B12 was normal today at 564.  Continue oral B12 supplements.   I spent a total of 20 minutes during this encounter with the patient including review of chart and various tests results, discussions about plan of care and coordination of care plan.  I reviewed lab results and outside records for this visit and discussed relevant results with the patient. Diagnosis, plan of care and treatment options were also discussed in detail with the patient. Opportunity provided to ask questions and answers provided to his apparent satisfaction. Provided instructions to call our clinic with any problems, questions or concerns prior to return visit. I recommended to continue follow-up with PCP and sub-specialists. He verbalized understanding and agreed with the plan. No barriers to learning was detected.  Chinita Patten, MD  03/05/2023 1:48 PM  [facility] - A DEPT OF [facility] [address] [place] [address] Dept: [phone] Dept Fax: [phone]   CHIEF COMPLAINT/ REASON FOR VISIT:  Follow-up for history of iron deficiency anemia.  INTERVAL HISTORY:  Discussed the use of AI scribe software for clinical note transcription with the patient, who gave verbal consent to proceed.   Patient returns for follow-up.  SUMMARY OF HEMATOLOGIC HISTORY:  On his routine follow-up with his PCP on 10/08/2022, hemoglobin was 12.9, hematocrit 40.5, MCV 83.2.  White count, platelet count were within normal limits.  Previously also he has had iron deficiency  anemia with hemoglobin as low as 10.9 in July 2024.  Previously his iron studies indicated severe iron deficiency with ferritin of 7, iron saturation decreased at 10%.  He was previously treated with oral iron supplements.  Since anemia persisted, referral was sent to us  for consideration of IV  iron.   Colonoscopy in July 2020. Mild diverticulosis noted. No polyps or other abnormalities. No biopsies performed. Plan for 10-year repeat, stool testing in 5 years. Negative recent stool test for blood.    The patient has been taking over-the-counter iron supplements, which have improved his hemoglobin levels and iron stores. However, the cause of the patient's iron deficiency remains unknown. The patient denies any noticeable blood loss, such as blood in stools or nosebleeds. He underwent a colonoscopy in 2020, which did not reveal any abnormalities. The patient does not follow a special diet and denies symptoms of acid reflux or heartburn. He occasionally takes over-the-counter pain medications, such as Aleve, for muscle soreness after working out. The patient also reports occasional bleeding from hemorrhoids, particularly after bike riding. He donated a kidney to his half-brother in 2021. The patient is otherwise active, engaging in regular physical activities such as bike riding and kayaking.   He denies recent chest pain on exertion, shortness of breath on minimal exertion, pre-syncopal episodes, or palpitations.   He had not noticed any recent bleeding such as epistaxis, hematuria or hematochezia   He had no prior history or diagnosis of cancer. His age appropriate screening programs are up-to-date.   Recent onset anemia with improvement on oral iron supplementation. No overt signs of bleeding. Possible microscopic blood loss or absorption issue. Hemorrhoidal bleeding reported with physical activity.  On his initial consultation with us  on 12/03/2022, labs revealed hemoglobin of 14.5, hematocrit 41.8, MCV 85.5, essentially normal parameters.  White count 5900 with normal differential.  Platelet count normal at 192,000.  CMP grossly unremarkable.  Iron studies and ferritin were within normal limits.  Folate normal. Vitamin B12 did come back low at 157.  Continue oral iron supplementation with  vitamin C.   He was started on vitamin B12 1000 mcg orally daily in November 2024.  On his repeat visit with us  on 03/05/2023, labs continued to be unremarkable.  Hemoglobin was 14.7, MCV 88.  White count and platelet count were normal.  Iron studies, B12 are normal at this time.  I do not see need for extensive workup at this time.  Patient was discharged from our office after his visit in February 2025.  Consider referral to GI for upper endoscopy if anemia or iron deficiency recurs.  I have reviewed the past medical history, past surgical history, social history and family history with the patient and they are unchanged from previous note.  ALLERGIES: He is allergic to sulfa antibiotics.  MEDICATIONS:  Current Outpatient Medications  Medication Sig Dispense Refill   atorvastatin  (LIPITOR) 10 MG tablet Take 1 tablet (10 mg total) by mouth daily. 90 tablet 0   cyanocobalamin  (VITAMIN B12) 1000 MCG tablet Take 1 tablet (1,000 mcg total) by mouth daily. 90 tablet 3   EPINEPHrine  0.3 mg/0.3 mL IJ SOAJ injection Inject 0.3 mLs (0.3 mg total) into the muscle once. 1 Device 1   Melatonin 5 MG TABS Take 5 mg by mouth at bedtime.     naproxen sodium (ALEVE) 220 MG tablet Take 440 mg by mouth as needed (pain).     No current facility-administered medications for this visit.     REVIEW  OF SYSTEMS:    ROS  All other pertinent systems were reviewed with the patient and are negative.  PHYSICAL EXAMINATION:   Onc Performance Status - 03/05/23 1117       ECOG Perf Status   ECOG Perf Status Fully active, able to carry on all pre-disease performance without restriction      KPS SCALE   KPS % SCORE Normal, no compliants, no evidence of disease             Vitals:   03/05/23 1054  BP: (!) 141/103  Pulse: 79  Resp: 18  Temp: 98.2 F (36.8 C)  SpO2: 97%   Filed Weights   03/05/23 1054  Weight: 165 lb 4.8 oz (75 kg)    Physical Exam Constitutional:      General: He is not in  acute distress.    Appearance: Normal appearance.  HENT:     Head: Normocephalic and atraumatic.  Eyes:     General: No scleral icterus.    Conjunctiva/sclera: Conjunctivae normal.  Cardiovascular:     Rate and Rhythm: Normal rate and regular rhythm.     Heart sounds: Normal heart sounds.  Pulmonary:     Effort: Pulmonary effort is normal.     Breath sounds: Normal breath sounds.  Abdominal:     General: There is no distension.  Neurological:     General: No focal deficit present.     Mental Status: He is alert and oriented to person, place, and time.  Psychiatric:        Mood and Affect: Mood normal.        Behavior: Behavior normal.        Thought Content: Thought content normal.      LABORATORY DATA:   I have reviewed the data as listed.  Results for orders placed or performed in visit on 03/05/23  Vitamin B12  Result Value Ref Range   Vitamin B-12 564 180 - 914 pg/mL  Iron and Iron Binding Capacity ([facility] only)  Result Value Ref Range   Iron 136 45 - 182 ug/dL   TIBC 646 749 - 549 ug/dL   Saturation Ratios 39 17.9 - 39.5 %   UIBC 217 117 - 376 ug/dL  Comprehensive metabolic panel  Result Value Ref Range   Sodium 141 135 - 145 mmol/L   Potassium 4.0 3.5 - 5.1 mmol/L   Chloride 107 98 - 111 mmol/L   CO2 26 22 - 32 mmol/L   Glucose, Bld 88 70 - 99 mg/dL   BUN 9 8 - 23 mg/dL   Creatinine, Ser 9.06 0.61 - 1.24 mg/dL   Calcium  9.5 8.9 - 10.3 mg/dL   Total Protein 7.4 6.5 - 8.1 g/dL   Albumin 4.7 3.5 - 5.0 g/dL   AST 55 (H) 15 - 41 U/L   ALT 65 (H) 0 - 44 U/L   Alkaline Phosphatase 101 38 - 126 U/L   Total Bilirubin 0.5 0.0 - 1.2 mg/dL   GFR, Estimated >39 >39 mL/min   Anion gap 8 5 - 15  CBC with Differential/Platelet  Result Value Ref Range   WBC 6.1 4.0 - 10.5 K/uL   RBC 4.75 4.22 - 5.81 MIL/uL   Hemoglobin 14.7 13.0 - 17.0 g/dL   HCT 57.7 60.9 - 47.9 %   MCV 88.8 80.0 - 100.0 fL   MCH 30.9 26.0 - 34.0 pg   MCHC 34.8 30.0 - 36.0 g/dL   RDW 86.3  88.4 - 84.4 %  Platelets 206 150 - 400 K/uL   nRBC 0.0 0.0 - 0.2 %   Neutrophils Relative % 63 %   Neutro Abs 3.8 1.7 - 7.7 K/uL   Lymphocytes Relative 24 %   Lymphs Abs 1.5 0.7 - 4.0 K/uL   Monocytes Relative 8 %   Monocytes Absolute 0.5 0.1 - 1.0 K/uL   Eosinophils Relative 3 %   Eosinophils Absolute 0.2 0.0 - 0.5 K/uL   Basophils Relative 2 %   Basophils Absolute 0.1 0.0 - 0.1 K/uL   Immature Granulocytes 0 %   Abs Immature Granulocytes 0.02 0.00 - 0.07 K/uL    RADIOGRAPHIC STUDIES:  No recent pertinent imaging studies available to review.  No orders of the defined types were placed in this encounter.    Future Appointments  Date Time Provider Department Center  04/08/2023  8:40 AM Levora Reyes SAUNDERS, MD [facility]  06/03/2023  8:00 AM Levora Reyes SAUNDERS, MD [facility]     This document was completed utilizing speech recognition software. Grammatical errors, random word insertions, pronoun errors, and incomplete sentences are an occasional consequence of this system due to software limitations, ambient noise, and hardware issues. Any formal questions or concerns about the content, text or information contained within the body of this dictation should be directly addressed to the provider for clarification.

## 2023-03-30 DIAGNOSIS — N402 Nodular prostate without lower urinary tract symptoms: Secondary | ICD-10-CM | POA: Diagnosis not present

## 2023-04-04 ENCOUNTER — Other Ambulatory Visit: Payer: Self-pay | Admitting: Family Medicine

## 2023-04-04 DIAGNOSIS — E785 Hyperlipidemia, unspecified: Secondary | ICD-10-CM

## 2023-04-05 ENCOUNTER — Other Ambulatory Visit: Payer: Self-pay

## 2023-04-05 ENCOUNTER — Other Ambulatory Visit (HOSPITAL_COMMUNITY): Payer: Self-pay

## 2023-04-05 MED ORDER — ATORVASTATIN CALCIUM 10 MG PO TABS
10.0000 mg | ORAL_TABLET | Freq: Every day | ORAL | 0 refills | Status: DC
  Filled 2023-04-05: qty 90, 90d supply, fill #0

## 2023-04-06 DIAGNOSIS — R350 Frequency of micturition: Secondary | ICD-10-CM | POA: Diagnosis not present

## 2023-04-06 DIAGNOSIS — N401 Enlarged prostate with lower urinary tract symptoms: Secondary | ICD-10-CM | POA: Diagnosis not present

## 2023-04-06 DIAGNOSIS — N402 Nodular prostate without lower urinary tract symptoms: Secondary | ICD-10-CM | POA: Diagnosis not present

## 2023-04-08 ENCOUNTER — Encounter: Payer: Self-pay | Admitting: Family Medicine

## 2023-04-08 ENCOUNTER — Ambulatory Visit (INDEPENDENT_AMBULATORY_CARE_PROVIDER_SITE_OTHER): Payer: BC Managed Care – PPO | Admitting: Family Medicine

## 2023-04-08 VITALS — BP 132/74 | HR 67 | Temp 97.8°F | Ht 70.0 in | Wt 169.0 lb

## 2023-04-08 DIAGNOSIS — R7989 Other specified abnormal findings of blood chemistry: Secondary | ICD-10-CM

## 2023-04-08 DIAGNOSIS — D509 Iron deficiency anemia, unspecified: Secondary | ICD-10-CM | POA: Diagnosis not present

## 2023-04-08 DIAGNOSIS — E785 Hyperlipidemia, unspecified: Secondary | ICD-10-CM | POA: Diagnosis not present

## 2023-04-08 LAB — COMPREHENSIVE METABOLIC PANEL
ALT: 54 U/L — ABNORMAL HIGH (ref 0–53)
AST: 49 U/L — ABNORMAL HIGH (ref 0–37)
Albumin: 4.8 g/dL (ref 3.5–5.2)
Alkaline Phosphatase: 92 U/L (ref 39–117)
BUN: 9 mg/dL (ref 6–23)
CO2: 28 meq/L (ref 19–32)
Calcium: 9.7 mg/dL (ref 8.4–10.5)
Chloride: 103 meq/L (ref 96–112)
Creatinine, Ser: 0.98 mg/dL (ref 0.40–1.50)
GFR: 80.44 mL/min (ref >60.00)
Glucose, Bld: 103 mg/dL — ABNORMAL HIGH (ref 70–99)
Potassium: 4 meq/L (ref 3.5–5.1)
Sodium: 138 meq/L (ref 135–145)
Total Bilirubin: 0.4 mg/dL (ref 0.2–1.2)
Total Protein: 7.3 g/dL (ref 6.0–8.3)

## 2023-04-08 LAB — LIPID PANEL
Cholesterol: 195 mg/dL (ref 0–200)
HDL: 78.3 mg/dL (ref >39.00)
LDL Cholesterol: 97 mg/dL (ref 0–99)
NonHDL: 116.48
Total CHOL/HDL Ratio: 2
Triglycerides: 97 mg/dL (ref 0.0–149.0)
VLDL: 19.4 mg/dL (ref 0.0–40.0)

## 2023-04-08 NOTE — Progress Notes (Signed)
 Subjective:  Patient ID: Gregory Petersen, male    DOB: 08/28/1955  Age: 68 y.o. MRN: [id number]  CC:  Chief Complaint  Patient presents with   Medical Management of Chronic Issues    Pt is well no concerns from him, pt is fasting    Health Maintenance    Patient has not had a COVID vaccine in 2024, patient states he has had Pneumonia, and shingles at the pharmacy will look up on [facility]     HPI Gregory Petersen presents for   Hyperlipidemia: Lipitor 10 mg daily.  Started in September. No myalgias/side effects.  Cardiology note reviewed from November 19.  Dilated calcium score minimal, updated TTE to assess PA pressures TR and RV size and function or if shunt with bubble study. This was performed in January, LV/RV normal no pulmonary hypertension, bubble study negative for shunt and overall looks good per Dr. Eden Emms.   Lab Results  Component Value Date   CHOL 260 (H) 07/02/2022   HDL 69.50 07/02/2022   LDLCALC 165 (H) 07/02/2022   LDLDIRECT 171.0 07/15/2020   TRIG 127.0 07/02/2022   CHOLHDL 4 07/02/2022   Lab Results  Component Value Date   ALT 65 (H) 03/05/2023   AST 55 (H) 03/05/2023   ALKPHOS 101 03/05/2023   BILITOT 0.5 03/05/2023   Elevated LFTs Few beers after work daily, few per day on weekends. No recent changes. No difficulty in cutting back.  No abd pain, n/v, jaundice, scleral icterus.  Started on statin 09/2022.ast 40, alt 33 in June last year, normal in July.   No tylenol.  Lab Results  Component Value Date   ALT 65 (H) 03/05/2023   AST 55 (H) 03/05/2023   ALKPHOS 101 03/05/2023   BILITOT 0.5 03/05/2023   Urology visit yesterday - PSA normal, good report.   Microcytic anemia Followed by hematology, note reviewed from February 7.  Improved on oral iron supplementation without overt signs of bleeding.  Started B12 1000 mcg daily in November 2024.  Since blood counts were within normal limits at that follow-up, was discharged for continued follow-up with PCP.  If  MRI or deficiency recurs consider referral to GI for upper endoscopy.  Still on iron supplement with vit C, B12 supplement.   Plan on [place] trip in April for 2 weeks for vacation. Plans on covid booster prior to traveling.   Lab Results  Component Value Date   WBC 6.1 03/05/2023   HGB 14.7 03/05/2023   HCT 42.2 03/05/2023   MCV 88.8 03/05/2023   PLT 206 03/05/2023   Lab Results  Component Value Date   VITAMINB12 564 03/05/2023     History Patient Active Problem List   Diagnosis Date Noted   Microcytic anemia 12/03/2022   Dyslipidemia 12/03/2022   Vitamin B12 deficiency 12/03/2022   History of nephrectomy    Hematuria 06/19/2011   Hemorrhoids, internal, with bleeding & occasional prolapse 06/17/2011   Past Medical History:  Diagnosis Date   Allergy    Anemia    Hematuria    Hemorrhoids    Hemorrhoids, internal, with bleeding & occasional prolapse 06/17/2011   History of nephrectomy    Laceration of nose    Medial meniscus tear    MEDIAL MENISCUS TEAR, LEFT 12/22/2005   Qualifier: Diagnosis of  By: Mannie Stabile MD, JOSEPH     Nasal congestion    Renal disorder    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 Authorizing Provider  atorvastatin (LIPITOR) 10 MG tablet Take 1 tablet (10 mg total) by mouth daily. 04/05/23  Yes [name], MD  cyanocobalamin (VITAMIN B12) 1000 MCG tablet Take 1 tablet (1,000 mcg total) by mouth daily. 12/03/22  Yes Pasam, Avinash, MD  EPINEPHrine 0.3 mg/0.3 mL IJ SOAJ injection Inject 0.3 mLs (0.3 mg total) into the muscle once. 09/07/15  Yes Kindl, Quita Skye, MD  Melatonin 5 MG TABS Take 5  mg by mouth at bedtime.   Yes [provider]  naproxen sodium (ALEVE) 220 MG tablet Take 440 mg by mouth as needed (pain).   Yes [provider]   Social History   Socioeconomic History   Marital status: Married    Spouse name: Not on file   Number of children: 1   Years of education: Not on file   Highest education level: Not on file  Occupational History   Not on file  Tobacco Use   Smoking status: Former    Current packs/day: 0.00    Types: Cigarettes    Quit date: 06/16/1989    Years since quitting: 33.8   Smokeless tobacco: Never  Vaping Use   Vaping status: Never Used  Substance and Sexual Activity   Alcohol use: Yes    Comment: occasional   Drug use: No   Sexual activity: Yes    Birth control/protection: None  Other Topics Concern   Not on file  Social History Narrative   Marital status: married x 1985      Children: 1 daughter (61), 2 grandchildren      Lives: with wife      Employment: Pension scheme manager; worked in [employer] x 22 years      Tobacco: none      Alcohol:  5-10 drinks per week. Craft beers      Exercise: gym 3-4 times per week; rides a bike daily.  Rock climbing.  [employer].    Social Drivers of Corporate investment banker Strain: Not on file  Food Insecurity: No Food Insecurity (12/03/2022)   Hunger Vital Sign    Worried About Running Out of Food in the Last Year: Never true    Ran Out of Food in the Last Year: Never true  Transportation Needs: No Transportation Needs (12/03/2022)   PRAPARE - Administrator, Civil Service (Medical): No    Lack of Transportation (Non-Medical): No  Physical Activity: Not on file  Stress: Not on file  Social Connections: Not on file  Intimate Partner Violence: Not At Risk (12/03/2022)   Humiliation, Afraid, Rape, and Kick questionnaire    Fear of Current or Ex-Partner: No    Emotionally Abused: No    Physically Abused: No    Sexually Abused: No    Review of  Systems Per HPI  Objective:   Vitals:   04/08/23 0837  BP: 132/74  Pulse: 67  Temp: 97.8 F (36.6 C)  TempSrc: Temporal  SpO2: 98%  Weight: 169 lb (76.7 kg)  Height: 5\' 10"  (1.778 m)     Physical Exam Vitals reviewed.  Constitutional:      Appearance: He is well-developed.  HENT:     Head: Normocephalic and atraumatic.  Neck:     Vascular: No carotid bruit or JVD.  Cardiovascular:     Rate and Rhythm: Normal rate and regular rhythm.     Heart sounds: Normal heart sounds. No murmur heard. Pulmonary:     Effort: Pulmonary effort is normal.     Breath sounds: Normal breath sounds. No rales.  Musculoskeletal:     Right lower leg: No edema.     Left lower leg: No edema.  Skin:    General: Skin is warm and dry.  Neurological:     Mental Status: He is alert and oriented to person, place, and time.  Psychiatric:        Mood and Affect: Mood normal.     Assessment & Plan:  Gregory Petersen is a 68 y.o. male . Hyperlipidemia, unspecified hyperlipidemia type - Plan: Comprehensive metabolic panel, Lipid panel  -Tolerating Lipitor, check LFTs, lipids.  Slight elevation of LFTs could be related to statin or combination of statin plus alcohol use.  May need to cut back on alcohol versus temporary trial of statin, can be decided once we review labs.  Asymptomatic.  Iron deficiency anemia, unspecified iron deficiency anemia type  -Stable recent testing, continue iron, vitamin C.  Can repeat CBC at next visit, recent testing reviewed.  Continue B12 supplement.  LFT elevation - Plan: Comprehensive metabolic panel  -As above, repeat testing and then plan to be decided.   No orders of the defined types were placed in this encounter.  Patient Instructions  I will check labs today, and then decide if need to stop cholesterol pill for elevated liver tests, or cutting back on alcohol may also help.   No med changes for now.   Thanks for coming in today.     Signed,   Meredith Staggers, MD [facility], [facility] 04/08/23 9:38 AM

## 2023-04-08 NOTE — Patient Instructions (Addendum)
 I will check labs today, and then decide if need to stop cholesterol pill for elevated liver tests, or cutting back on alcohol may also help.   No med changes for now.   Thanks for coming in today.

## 2023-04-11 ENCOUNTER — Encounter: Payer: Self-pay | Admitting: Family Medicine

## 2023-04-11 NOTE — Progress Notes (Signed)
 Results sent by MyChart, please schedule lab visit for hepatic function panel, diagnosis elevated LFTs in the next 2 to 3 weeks.  Thanks.

## 2023-06-03 ENCOUNTER — Encounter: Payer: BLUE CROSS/BLUE SHIELD | Admitting: Family Medicine

## 2023-06-19 ENCOUNTER — Other Ambulatory Visit (HOSPITAL_COMMUNITY): Payer: Self-pay

## 2023-06-19 DIAGNOSIS — M25562 Pain in left knee: Secondary | ICD-10-CM | POA: Diagnosis not present

## 2023-06-19 MED ORDER — TRAMADOL HCL 50 MG PO TABS
50.0000 mg | ORAL_TABLET | Freq: Three times a day (TID) | ORAL | 0 refills | Status: AC | PRN
  Filled 2023-06-19: qty 15, 5d supply, fill #0

## 2023-06-23 ENCOUNTER — Encounter: Admitting: Family Medicine

## 2023-06-24 ENCOUNTER — Other Ambulatory Visit: Payer: Self-pay | Admitting: Family Medicine

## 2023-06-24 DIAGNOSIS — M25562 Pain in left knee: Secondary | ICD-10-CM | POA: Diagnosis not present

## 2023-06-24 DIAGNOSIS — E785 Hyperlipidemia, unspecified: Secondary | ICD-10-CM

## 2023-06-24 DIAGNOSIS — Z1811 Retained magnetic metal fragments: Secondary | ICD-10-CM | POA: Diagnosis not present

## 2023-06-25 DIAGNOSIS — M25562 Pain in left knee: Secondary | ICD-10-CM | POA: Diagnosis not present

## 2023-06-25 DIAGNOSIS — Z1811 Retained magnetic metal fragments: Secondary | ICD-10-CM | POA: Diagnosis not present

## 2023-06-28 ENCOUNTER — Other Ambulatory Visit (HOSPITAL_COMMUNITY): Payer: Self-pay

## 2023-06-30 ENCOUNTER — Ambulatory Visit: Admitting: Family Medicine

## 2023-07-02 ENCOUNTER — Other Ambulatory Visit: Payer: Self-pay | Admitting: Family Medicine

## 2023-07-02 DIAGNOSIS — M7122 Synovial cyst of popliteal space [Baker], left knee: Secondary | ICD-10-CM | POA: Diagnosis not present

## 2023-07-02 DIAGNOSIS — M2242 Chondromalacia patellae, left knee: Secondary | ICD-10-CM | POA: Diagnosis not present

## 2023-07-02 DIAGNOSIS — E785 Hyperlipidemia, unspecified: Secondary | ICD-10-CM

## 2023-07-02 DIAGNOSIS — S83242A Other tear of medial meniscus, current injury, left knee, initial encounter: Secondary | ICD-10-CM | POA: Diagnosis not present

## 2023-07-02 DIAGNOSIS — M25562 Pain in left knee: Secondary | ICD-10-CM | POA: Diagnosis not present

## 2023-07-05 ENCOUNTER — Telehealth: Payer: Self-pay

## 2023-07-05 ENCOUNTER — Other Ambulatory Visit (HOSPITAL_COMMUNITY): Payer: Self-pay

## 2023-07-05 MED ORDER — ATORVASTATIN CALCIUM 10 MG PO TABS
10.0000 mg | ORAL_TABLET | Freq: Every day | ORAL | 0 refills | Status: DC
  Filled 2023-07-05: qty 90, 90d supply, fill #0

## 2023-07-05 NOTE — Telephone Encounter (Signed)
 Left vm to call office to schedule appt. Dr.Greene please advise on refill until then?  Per last visit 10/08/2022: Thanks for coming in today.  I will check the blood counts and iron levels but depending on those readings I would consider meeting with hematology to look into other causes of anemia.  We can repeat labs first.   Start atorvastatin  (Lipitor) once per week initially then increase by 1 dose per week as long as that is tolerated.  Recheck in approximately 6 weeks for repeat liver test and cholesterol levels, that can be a lab only visit.   I will refer you to cardiology to review the coronary calcium  score exam and possible pulmonary artery abnormality.  I suspect they will want you to have an echocardiogram but that can be discussed with cardiology.  Let me know if you do not hear from them within the next 2 weeks.   Take care.

## 2023-07-05 NOTE — Telephone Encounter (Signed)
 Copied from CRM [id number]. Topic: General - Other >> Jul 05, 2023  9:26 AM Gregory Petersen wrote: Reason for CRM: Pt advise he will call to schedule appt after he complete physical therapy and get his leg injury taken care of.

## 2023-07-14 DIAGNOSIS — M25562 Pain in left knee: Secondary | ICD-10-CM | POA: Diagnosis not present

## 2023-07-19 DIAGNOSIS — M25562 Pain in left knee: Secondary | ICD-10-CM | POA: Diagnosis not present

## 2023-07-22 DIAGNOSIS — M25562 Pain in left knee: Secondary | ICD-10-CM | POA: Diagnosis not present

## 2023-07-26 DIAGNOSIS — M25562 Pain in left knee: Secondary | ICD-10-CM | POA: Diagnosis not present

## 2023-07-27 DIAGNOSIS — S83242A Other tear of medial meniscus, current injury, left knee, initial encounter: Secondary | ICD-10-CM | POA: Diagnosis not present

## 2023-07-27 DIAGNOSIS — M25562 Pain in left knee: Secondary | ICD-10-CM | POA: Diagnosis not present

## 2023-07-29 DIAGNOSIS — M25562 Pain in left knee: Secondary | ICD-10-CM | POA: Diagnosis not present

## 2023-08-30 ENCOUNTER — Telehealth: Payer: Self-pay

## 2023-08-30 NOTE — Telephone Encounter (Signed)
 Patient is wanting to schedule a CPE. Please see message below for more details.    Copied from CRM [id number]. Topic: Appointments - Scheduling Inquiry for Clinic >> Aug 30, 2023  2:34 PM Chiquita SQUIBB wrote: Reason for CRM: Patient is calling in to schedule to a physical appointment. When scheduling through the DT it would only pull a welcome to Medicare appointment type the patient denied having that done and just wants the normal physical with Dr, Levora please help the patient with the scheduling.

## 2023-09-23 ENCOUNTER — Other Ambulatory Visit (HOSPITAL_COMMUNITY): Payer: Self-pay

## 2023-09-25 ENCOUNTER — Other Ambulatory Visit (HOSPITAL_COMMUNITY): Payer: Self-pay

## 2023-09-29 ENCOUNTER — Other Ambulatory Visit (HOSPITAL_COMMUNITY): Payer: Self-pay

## 2023-10-11 ENCOUNTER — Encounter: Admitting: Family Medicine

## 2023-10-12 ENCOUNTER — Other Ambulatory Visit: Payer: Self-pay | Admitting: Family Medicine

## 2023-10-12 ENCOUNTER — Other Ambulatory Visit (HOSPITAL_COMMUNITY): Payer: Self-pay

## 2023-10-12 DIAGNOSIS — E785 Hyperlipidemia, unspecified: Secondary | ICD-10-CM

## 2023-10-12 MED ORDER — ATORVASTATIN CALCIUM 10 MG PO TABS
10.0000 mg | ORAL_TABLET | Freq: Every day | ORAL | 0 refills | Status: DC
Start: 2023-10-12 — End: 2023-12-09
  Filled 2023-10-12: qty 90, 90d supply, fill #0

## 2023-10-14 DIAGNOSIS — S83242A Other tear of medial meniscus, current injury, left knee, initial encounter: Secondary | ICD-10-CM | POA: Diagnosis not present

## 2023-10-15 DIAGNOSIS — Z4789 Encounter for other orthopedic aftercare: Secondary | ICD-10-CM | POA: Insufficient documentation

## 2023-11-01 ENCOUNTER — Other Ambulatory Visit (HOSPITAL_COMMUNITY): Payer: Self-pay

## 2023-11-01 DIAGNOSIS — S83232A Complex tear of medial meniscus, current injury, left knee, initial encounter: Secondary | ICD-10-CM | POA: Diagnosis not present

## 2023-11-01 DIAGNOSIS — M94262 Chondromalacia, left knee: Secondary | ICD-10-CM | POA: Diagnosis not present

## 2023-11-01 DIAGNOSIS — Y999 Unspecified external cause status: Secondary | ICD-10-CM | POA: Diagnosis not present

## 2023-11-01 DIAGNOSIS — M65962 Unspecified synovitis and tenosynovitis, left lower leg: Secondary | ICD-10-CM | POA: Diagnosis not present

## 2023-11-01 DIAGNOSIS — X58XXXA Exposure to other specified factors, initial encounter: Secondary | ICD-10-CM | POA: Diagnosis not present

## 2023-11-01 DIAGNOSIS — G8918 Other acute postprocedural pain: Secondary | ICD-10-CM | POA: Diagnosis not present

## 2023-11-01 DIAGNOSIS — M11262 Other chondrocalcinosis, left knee: Secondary | ICD-10-CM | POA: Diagnosis not present

## 2023-11-01 HISTORY — PX: MENISCUS DEBRIDEMENT: SHX5178

## 2023-11-01 MED ORDER — HYDROCODONE-ACETAMINOPHEN 5-325 MG PO TABS
1.0000 | ORAL_TABLET | Freq: Four times a day (QID) | ORAL | 0 refills | Status: DC | PRN
  Filled 2023-11-01: qty 15, 4d supply, fill #0

## 2023-11-01 MED ORDER — ONDANSETRON 4 MG PO TBDP
4.0000 mg | ORAL_TABLET | Freq: Three times a day (TID) | ORAL | 0 refills | Status: AC
  Filled 2023-11-01: qty 20, 7d supply, fill #0

## 2023-11-08 DIAGNOSIS — M25562 Pain in left knee: Secondary | ICD-10-CM | POA: Diagnosis not present

## 2023-11-10 DIAGNOSIS — M25562 Pain in left knee: Secondary | ICD-10-CM | POA: Diagnosis not present

## 2023-11-12 DIAGNOSIS — M25562 Pain in left knee: Secondary | ICD-10-CM | POA: Diagnosis not present

## 2023-11-16 DIAGNOSIS — M25562 Pain in left knee: Secondary | ICD-10-CM | POA: Diagnosis not present

## 2023-11-22 DIAGNOSIS — M25562 Pain in left knee: Secondary | ICD-10-CM | POA: Diagnosis not present

## 2023-11-25 DIAGNOSIS — M25562 Pain in left knee: Secondary | ICD-10-CM | POA: Diagnosis not present

## 2023-11-30 DIAGNOSIS — M25562 Pain in left knee: Secondary | ICD-10-CM | POA: Diagnosis not present

## 2023-12-02 DIAGNOSIS — M25562 Pain in left knee: Secondary | ICD-10-CM | POA: Diagnosis not present

## 2023-12-07 DIAGNOSIS — M25562 Pain in left knee: Secondary | ICD-10-CM | POA: Diagnosis not present

## 2023-12-09 ENCOUNTER — Ambulatory Visit (INDEPENDENT_AMBULATORY_CARE_PROVIDER_SITE_OTHER): Admitting: Family Medicine

## 2023-12-09 ENCOUNTER — Encounter: Payer: Self-pay | Admitting: Family Medicine

## 2023-12-09 ENCOUNTER — Other Ambulatory Visit (HOSPITAL_COMMUNITY): Payer: Self-pay

## 2023-12-09 VITALS — BP 128/70 | HR 77 | Temp 97.7°F | Resp 16 | Ht 70.0 in | Wt 164.8 lb

## 2023-12-09 DIAGNOSIS — E785 Hyperlipidemia, unspecified: Secondary | ICD-10-CM

## 2023-12-09 DIAGNOSIS — D509 Iron deficiency anemia, unspecified: Secondary | ICD-10-CM

## 2023-12-09 DIAGNOSIS — Z905 Acquired absence of kidney: Secondary | ICD-10-CM

## 2023-12-09 DIAGNOSIS — E538 Deficiency of other specified B group vitamins: Secondary | ICD-10-CM | POA: Diagnosis not present

## 2023-12-09 DIAGNOSIS — Z0000 Encounter for general adult medical examination without abnormal findings: Secondary | ICD-10-CM | POA: Diagnosis not present

## 2023-12-09 DIAGNOSIS — M25562 Pain in left knee: Secondary | ICD-10-CM | POA: Diagnosis not present

## 2023-12-09 DIAGNOSIS — Z23 Encounter for immunization: Secondary | ICD-10-CM | POA: Diagnosis not present

## 2023-12-09 MED ORDER — ATORVASTATIN CALCIUM 10 MG PO TABS
10.0000 mg | ORAL_TABLET | Freq: Every day | ORAL | 3 refills | Status: AC
Start: 2023-12-09 — End: ?
  Filled 2023-12-09 – 2024-01-06 (×2): qty 90, 90d supply, fill #0

## 2023-12-09 NOTE — Progress Notes (Signed)
 Subjective:  Patient ID: Gregory Petersen, male    DOB: September 03, 1955  Age: 68 y.o. MRN: [id number]  CC:  Chief Complaint  Patient presents with   Annual Exam    Pt is doing well no concerns, pt is not fasting     HPI Gregory Petersen presents for Annual Exam  PCP, me Ortho, Dr. Sharl for left knee pain, medial meniscal tear, surgery 11/01/23, has undergone PT, finished today. Doing better.  Urology, Dr. Selma, nodular prostate, BPH with office visit in March.  Annual PSA recommended.  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 Selma on 04/06/2023.  PSA 3.20 on 03/30/2023.  Prostate MRI September 2024 with no suspicious lesion.  Continued 1 year follow-up for PSA trending.  Declined alpha-blocker for chronic prostatitis symptoms. Lab Results  Component Value Date   CREATININE 0.98 04/08/2023  Oncology, Dr. Autumn, eval in February for microcytic anemia.  Prior B12 deficiency, improved on B12 supplementation.  Oral iron supplementation and vitamin C.  Unremarkable repeat labs with hemoglobin 14.7, MCV 88 in February.  Further workup deferred with normal iron and B12 at that time.  Follow-up only as needed. Cardiology, evaluated by Dr. Nishan for questionable pulmonary hypertension.  Echo January 3 with LV/RV normal, no pulmonary hypertension and bubble study was negative for shunt.  EF 60 to 65%   Hyperlipidemia: Treated with Lipitor 10 mg daily. No new myalgia/side effects.  Lab Results  Component Value Date   CHOL 195 04/08/2023   HDL 78.30 04/08/2023   LDLCALC 97 04/08/2023   LDLDIRECT 171.0 07/15/2020   TRIG 97.0 04/08/2023   CHOLHDL 2 04/08/2023   Lab Results  Component Value Date   ALT 54 (H) 04/08/2023   AST 49 (H) 04/08/2023   ALKPHOS 92 04/08/2023   BILITOT 0.4 04/08/2023         12/09/2023    3:42 PM 04/08/2023    8:36 AM 12/03/2022    8:57 AM 05/28/2022    1:00 PM 07/15/2020    3:00 PM  Depression screen PHQ 2/9  Decreased Interest 0  0 0 0 0  Down, Depressed, Hopeless 0 0 0 0 0  PHQ - 2 Score 0 0 0 0 0  Altered sleeping 0 0  0 0  Tired, decreased energy 0 0  0 0  Change in appetite 0 0  0 0  Feeling bad or failure about yourself  0 0  0 0  Trouble concentrating 0 0  0 0  Moving slowly or fidgety/restless 0 0  0 0  Suicidal thoughts 0 0  0 0  PHQ-9 Score 0 0   0  0   Difficult doing work/chores Not difficult at all   Not difficult at all      Data saved with a previous flowsheet row definition    Health Maintenance  Topic Date Due   Medicare Annual Wellness (AWV)  Never done   Pneumococcal Vaccine: 50+ Years (1 of 1 - PCV) Never done   Zoster Vaccines- Shingrix  (2 of 2) 07/23/2022   COVID-19 Vaccine (1 - 2025-26 season) Never done   DTaP/Tdap/Td (2 - Td or Tdap) 09/17/2026   Colonoscopy  08/25/2028   Influenza Vaccine  Completed   Hepatitis C Screening  Completed   Meningococcal B Vaccine  Aged Out  Prior colonoscopy with [facility], repeat 10 years - has been less than 10 yrs.  Prostate: Followed by urology as above.  Lab Results  Component Value Date   PSA1 2.5 09/16/2016   PSA 3.80 05/28/2022   PSA 2.46 07/15/2020    Immunization History  Administered Date(s) Administered   Fluad Trivalent(High Dose 65+) 12/03/2022   INFLUENZA, HIGH DOSE SEASONAL PF 12/09/2023   Tdap 09/16/2016   Zoster Recombinant(Shingrix ) 07/17/2020, 05/28/2022   Zoster, Live 05/28/2022  Flu vaccine today.  Covid booster - plans at pharmacy. Infection in May.   No results found. Wears glasses - appt with Dr. Glendia few months ago. No changes.   Dental: every 6 months.   Alcohol: 1-2 beers per night.   Tobacco: none  Exercise: back on bike, gym exercise 4-5 times per week with cardio and weights - some home weights as well.    History Patient Active Problem List   Diagnosis Date Noted   Encounter for other orthopedic aftercare 10/15/2023   Arthralgia of left knee 06/19/2023   Microcytic anemia 12/03/2022    Dyslipidemia 12/03/2022   Vitamin B12 deficiency 12/03/2022   History of nephrectomy    Hematuria 06/19/2011   Hemorrhoids, internal, with bleeding & occasional prolapse 06/17/2011   Past Medical History:  Diagnosis Date   Allergy    Anemia    Hematuria    Hemorrhoids    Hemorrhoids, internal, with bleeding & occasional prolapse 06/17/2011   History of nephrectomy    Laceration of nose    Medial meniscus tear    MEDIAL MENISCUS TEAR, LEFT 12/22/2005   Qualifier: Diagnosis of  By: LANEY MD, JOSEPH     Nasal congestion    Renal disorder    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 Authorizing Provider  atorvastatin  (LIPITOR) 10 MG tablet Take 1 tablet (10 mg total) by mouth daily. 10/12/23  Yes Levora Reyes SAUNDERS, MD  cyanocobalamin  (VITAMIN B12) 1000 MCG tablet Take 1 tablet (1,000 mcg total) by mouth daily. 12/03/22  Yes Pasam, Avinash, MD  EPINEPHrine  0.3 mg/0.3 mL IJ SOAJ injection Inject 0.3 mLs (0.3 mg total) into the muscle once. 09/07/15  Yes Kindl, Lynwood BIRCH, MD  HYDROcodone-acetaminophen  (NORCO/VICODIN) 5-325 MG tablet Take 1 tablet by mouth every 6 hours  as needed, for moderate pain. 11/01/23  Yes   Melatonin 5 MG TABS Take 5 mg by mouth at bedtime.   Yes [provider]  naproxen sodium (ALEVE) 220 MG tablet Take 440 mg by mouth as needed (pain).   Yes [provider]  ondansetron  (ZOFRAN -ODT) 4 MG disintegrating tablet Place 1 tablet under the tongue every 8 hours as needed, for nausea/vomiting. 11/01/23  Yes   traMADol  (ULTRAM ) 50 MG tablet Take 1 tablet (50 mg  total) by mouth every 8 (eight) hours as needed. 06/19/23  Yes    Social History   Socioeconomic History   Marital status: Married    Spouse name: Not on file   Number of children: 1   Years of education: Not on file   Highest education level: Associate degree: occupational, scientist, product/process development, or vocational program  Occupational History   Not  on file  Tobacco Use   Smoking status: Former    Current packs/day: 0.00    Types: Cigarettes    Quit date: 06/16/1989    Years since quitting: 34.5   Smokeless tobacco: Never  Vaping Use   Vaping status: Never Used  Substance and Sexual Activity   Alcohol use: Yes    Comment: occasional   Drug use: No   Sexual activity: Yes    Birth control/protection: None  Other Topics Concern   Not on file  Social History Narrative   Marital status: married x 1985      Children: 1 daughter (41), 2 grandchildren      Lives: with wife      Employment: pension scheme manager; worked in oceanographer television x 22 years      Tobacco: none      Alcohol:  5-10 drinks per week. Craft beers      Exercise: gym 3-4 times per week; rides a bike daily.  Rock climbing.  [employer].    Social Drivers of Corporate Investment Banker Strain: Low Risk  (05/27/2023)   Overall Financial Resource Strain (CARDIA)    Difficulty of Paying Living Expenses: Not hard at all  Food Insecurity: No Food Insecurity (05/27/2023)   Hunger Vital Sign    Worried About Running Out of Food in the Last Year: Never true    Ran Out of Food in the Last Year: Never true  Transportation Needs: No Transportation Needs (05/27/2023)   PRAPARE - Administrator, Civil Service (Medical): No    Lack of Transportation (Non-Medical): No  Physical Activity: Insufficiently Active (05/27/2023)   Exercise Vital Sign    Days of Exercise per Week: 3 days    Minutes of Exercise per Session: 40 min  Stress: No Stress Concern Present (05/27/2023)   [employer] of Occupational Health - Occupational Stress  Questionnaire    Feeling of Stress : Not at all  Social Connections: Moderately Isolated (05/27/2023)   Social Connection and Isolation Panel    Frequency of Communication with Friends and Family: More than three times a week    Frequency of Social Gatherings with Friends and Family: Once a week    Attends Religious Services: Never    Database Administrator or Organizations: No    Attends Engineer, Structural: Not on file    Marital Status: Married  Catering Manager Violence: Not At Risk (12/03/2022)   Humiliation, Afraid, Rape, and Kick questionnaire    Fear of Current or Ex-Partner: No    Emotionally Abused: No    Physically Abused: No    Sexually Abused: No    Review of Systems 13 point review of systems per patient health survey noted.  Negative other than as indicated above or in HPI.    Objective:   Vitals:   12/09/23 1536  BP: 128/70  Pulse: 77  Resp: 16  Temp: 97.7 F (36.5 C)  TempSrc: Temporal  SpO2: 99%  Weight: 164 lb 12.8 oz (74.8 kg)  Height: 5' 10 (1.778 m)     Physical Exam Vitals reviewed.  Constitutional:      Appearance: He is well-developed.  HENT:     Head: Normocephalic and atraumatic.     Right Ear: External ear normal.     Left Ear: External ear normal.  Eyes:     Conjunctiva/sclera: Conjunctivae normal.     Pupils: Pupils are equal, round, and reactive to light.  Neck:  Thyroid : No thyromegaly.  Cardiovascular:     Rate and Rhythm: Normal rate and regular rhythm.     Heart sounds: Normal heart sounds.  Pulmonary:     Effort: Pulmonary effort is normal. No respiratory distress.     Breath sounds: Normal breath sounds. No wheezing.  Abdominal:     General: There is no distension.     Palpations: Abdomen is soft.     Tenderness: There is no abdominal tenderness.  Musculoskeletal:        General: No tenderness. Normal range of motion.     Cervical back: Normal range of motion and neck supple.  Lymphadenopathy:      Cervical: No cervical adenopathy.  Skin:    General: Skin is warm and dry.  Neurological:     Mental Status: He is alert and oriented to person, place, and time.     Deep Tendon Reflexes: Reflexes are normal and symmetric.  Psychiatric:        Behavior: Behavior normal.     Assessment & Plan:  Gregory Petersen is a 68 y.o. male . Annual physical exam  - -anticipatory guidance as below in AVS, screening labs above. Health maintenance items as above in HPI discussed/recommended as applicable.   B12 deficiency - Plan: CBC, B12  - Prior stable level on B12 supplementation, check updated levels, no changes for now.  Microcytic anemia - Plan: CBC, Iron  - Evaluated by hematology as above with improved readings on supplementation, check updated CBC, iron.  Up-to-date on colonoscopy.  Solitary kidney, acquired - Plan: Comprehensive metabolic panel with GFR  - Check renal function.  Dyslipidemia - Plan: Lipid panel Hyperlipidemia, unspecified hyperlipidemia type - Plan: atorvastatin  (LIPITOR) 10 MG tablet  - Tolerating current dose statin, check labs and adjust plan accordingly.  Meds ordered this encounter  Medications   atorvastatin  (LIPITOR) 10 MG tablet    Sig: Take 1 tablet (10 mg total) by mouth daily.    Dispense:  90 tablet    Refill:  3   Patient Instructions  Thank you for coming in today. No change in medications at this time. If there are any concerns on your bloodwork, I will let you know. Take care! Preventive Care 35 Years and Older, Male Preventive care refers to lifestyle choices and visits with your health care provider that can promote health and wellness. Preventive care visits are also called wellness exams. What can I expect for my preventive care visit? Counseling During your preventive care visit, your health care provider may ask about your: Medical history, including: Past medical problems. Family medical history. History of falls. Current health,  including: Emotional well-being. Home life and relationship well-being. Sexual activity. Memory and ability to understand (cognition). Lifestyle, including: Alcohol, nicotine or tobacco, and drug use. Access to firearms. Diet, exercise, and sleep habits. Work and work astronomer. Sunscreen use. Safety issues such as seatbelt and bike helmet use. Physical exam Your health care provider will check your: Height and weight. These may be used to calculate your BMI (body mass index). BMI is a measurement that tells if you are at a healthy weight. Waist circumference. This measures the distance around your waistline. This measurement also tells if you are at a healthy weight and may help predict your risk of certain diseases, such as type 2 diabetes and high blood pressure. Heart rate and blood pressure. Body temperature. Skin for abnormal spots. What immunizations do I need?  Vaccines are usually given at various ages,  according to a schedule. Your health care provider will recommend vaccines for you based on your age, medical history, and lifestyle or other factors, such as travel or where you work. What tests do I need? Screening Your health care provider may recommend screening tests for certain conditions. This may include: Lipid and cholesterol levels. Diabetes screening. This is done by checking your blood sugar (glucose) after you have not eaten for a while (fasting). Hepatitis C test. Hepatitis B test. HIV (human immunodeficiency virus) test. STI (sexually transmitted infection) testing, if you are at risk. Lung cancer screening. Colorectal cancer screening. Prostate cancer screening. Abdominal aortic aneurysm (AAA) screening. You may need this if you are a current or former smoker. Talk with your health care provider about your test results, treatment options, and if necessary, the need for more tests. Follow these instructions at home: Eating and drinking  Eat a diet that  includes fresh fruits and vegetables, whole grains, lean protein, and low-fat dairy products. Limit your intake of foods with high amounts of sugar, saturated fats, and salt. Take vitamin and mineral supplements as recommended by your health care provider. Do not drink alcohol if your health care provider tells you not to drink. If you drink alcohol: Limit how much you have to 0-2 drinks a day. Know how much alcohol is in your drink. In the [place] one drink equals one 12 oz bottle of beer (355 mL), one 5 oz glass of wine (148 mL), or one 1 oz glass of hard liquor (44 mL). Lifestyle Brush your teeth every morning and night with fluoride toothpaste. Floss one time each day. Exercise for at least 30 minutes 5 or more days each week. Do not use any products that contain nicotine or tobacco. These products include cigarettes, chewing tobacco, and vaping devices, such as e-cigarettes. If you need help quitting, ask your health care provider. Do not use drugs. If you are sexually active, practice safe sex. Use a condom or other form of protection to prevent STIs. Take aspirin only as told by your health care provider. Make sure that you understand how much to take and what form to take. Work with your health care provider to find out whether it is safe and beneficial for you to take aspirin daily. Ask your health care provider if you need to take a cholesterol-lowering medicine (statin). Find healthy ways to manage stress, such as: Meditation, yoga, or listening to music. Journaling. Talking to a trusted person. Spending time with friends and family. Safety Always wear your seat belt while driving or riding in a vehicle. Do not drive: If you have been drinking alcohol. Do not ride with someone who has been drinking. When you are tired or distracted. While texting. If you have been using any mind-altering substances or drugs. Wear a helmet and other protective equipment during sports  activities. If you have firearms in your house, make sure you follow all gun safety procedures. Minimize exposure to UV radiation to reduce your risk of skin cancer. What's next? Visit your health care provider once a year for an annual wellness visit. Ask your health care provider how often you should have your eyes and teeth checked. Stay up to date on all vaccines. This information is not intended to replace advice given to you by your health care provider. Make sure you discuss any questions you have with your health care provider. Document Revised: 07/10/2020 Document Reviewed: 07/10/2020 Elsevier Patient Education  2024 [employer].  Signed,   Reyes Pines, MD [facility], [facility] 12/09/23 4:20 PM

## 2023-12-09 NOTE — Patient Instructions (Signed)
 Thank you for coming in today. No change in medications at this time. If there are any concerns on your bloodwork, I will let you know. Take care!  Preventive Care 23 Years and Older, Male Preventive care refers to lifestyle choices and visits with your health care provider that can promote health and wellness. Preventive care visits are also called wellness exams. What can I expect for my preventive care visit? Counseling During your preventive care visit, your health care provider may ask about your: Medical history, including: Past medical problems. Family medical history. History of falls. Current health, including: Emotional well-being. Home life and relationship well-being. Sexual activity. Memory and ability to understand (cognition). Lifestyle, including: Alcohol, nicotine or tobacco, and drug use. Access to firearms. Diet, exercise, and sleep habits. Work and work Astronomer. Sunscreen use. Safety issues such as seatbelt and bike helmet use. Physical exam Your health care provider will check your: Height and weight. These may be used to calculate your BMI (body mass index). BMI is a measurement that tells if you are at a healthy weight. Waist circumference. This measures the distance around your waistline. This measurement also tells if you are at a healthy weight and may help predict your risk of certain diseases, such as type 2 diabetes and high blood pressure. Heart rate and blood pressure. Body temperature. Skin for abnormal spots. What immunizations do I need?  Vaccines are usually given at various ages, according to a schedule. Your health care provider will recommend vaccines for you based on your age, medical history, and lifestyle or other factors, such as travel or where you work. What tests do I need? Screening Your health care provider may recommend screening tests for certain conditions. This may include: Lipid and cholesterol levels. Diabetes screening.  This is done by checking your blood sugar (glucose) after you have not eaten for a while (fasting). Hepatitis C test. Hepatitis B test. HIV (human immunodeficiency virus) test. STI (sexually transmitted infection) testing, if you are at risk. Lung cancer screening. Colorectal cancer screening. Prostate cancer screening. Abdominal aortic aneurysm (AAA) screening. You may need this if you are a current or former smoker. Talk with your health care provider about your test results, treatment options, and if necessary, the need for more tests. Follow these instructions at home: Eating and drinking  Eat a diet that includes fresh fruits and vegetables, whole grains, lean protein, and low-fat dairy products. Limit your intake of foods with high amounts of sugar, saturated fats, and salt. Take vitamin and mineral supplements as recommended by your health care provider. Do not drink alcohol if your health care provider tells you not to drink. If you drink alcohol: Limit how much you have to 0-2 drinks a day. Know how much alcohol is in your drink. In the [place] one drink equals one 12 oz bottle of beer (355 mL), one 5 oz glass of wine (148 mL), or one 1 oz glass of hard liquor (44 mL). Lifestyle Brush your teeth every morning and night with fluoride toothpaste. Floss one time each day. Exercise for at least 30 minutes 5 or more days each week. Do not use any products that contain nicotine or tobacco. These products include cigarettes, chewing tobacco, and vaping devices, such as e-cigarettes. If you need help quitting, ask your health care provider. Do not use drugs. If you are sexually active, practice safe sex. Use a condom or other form of protection to prevent STIs. Take aspirin only as told by your health  care provider. Make sure that you understand how much to take and what form to take. Work with your health care provider to find out whether it is safe and beneficial for you to take aspirin  daily. Ask your health care provider if you need to take a cholesterol-lowering medicine (statin). Find healthy ways to manage stress, such as: Meditation, yoga, or listening to music. Journaling. Talking to a trusted person. Spending time with friends and family. Safety Always wear your seat belt while driving or riding in a vehicle. Do not drive: If you have been drinking alcohol. Do not ride with someone who has been drinking. When you are tired or distracted. While texting. If you have been using any mind-altering substances or drugs. Wear a helmet and other protective equipment during sports activities. If you have firearms in your house, make sure you follow all gun safety procedures. Minimize exposure to UV radiation to reduce your risk of skin cancer. What's next? Visit your health care provider once a year for an annual wellness visit. Ask your health care provider how often you should have your eyes and teeth checked. Stay up to date on all vaccines. This information is not intended to replace advice given to you by your health care provider. Make sure you discuss any questions you have with your health care provider. Document Revised: 07/10/2020 Document Reviewed: 07/10/2020 Elsevier Patient Education  2024 [employer].

## 2023-12-10 LAB — COMPREHENSIVE METABOLIC PANEL WITH GFR
ALT: 24 U/L (ref 0–53)
AST: 23 U/L (ref 0–37)
Albumin: 4.5 g/dL (ref 3.5–5.2)
Alkaline Phosphatase: 93 U/L (ref 39–117)
BUN: 10 mg/dL (ref 6–23)
CO2: 28 meq/L (ref 19–32)
Calcium: 9.2 mg/dL (ref 8.4–10.5)
Chloride: 103 meq/L (ref 96–112)
Creatinine, Ser: 1.03 mg/dL (ref 0.40–1.50)
GFR: 75.42 mL/min (ref >60.00)
Glucose, Bld: 92 mg/dL (ref 70–99)
Potassium: 3.9 meq/L (ref 3.5–5.1)
Sodium: 140 meq/L (ref 135–145)
Total Bilirubin: 0.4 mg/dL (ref 0.2–1.2)
Total Protein: 7.3 g/dL (ref 6.0–8.3)

## 2023-12-10 LAB — CBC
HCT: 40.6 % (ref 39.0–52.0)
Hemoglobin: 13.7 g/dL (ref 13.0–17.0)
MCHC: 33.7 g/dL (ref 30.0–36.0)
MCV: 89.7 fl (ref 78.0–100.0)
Platelets: 218 K/uL (ref 150.0–400.0)
RBC: 4.53 Mil/uL (ref 4.22–5.81)
RDW: 13.8 % (ref 11.5–15.5)
WBC: 9.2 K/uL (ref 4.0–10.5)

## 2023-12-10 LAB — LIPID PANEL
Cholesterol: 177 mg/dL (ref 0–200)
HDL: 63.1 mg/dL (ref >39.00)
LDL Cholesterol: 84 mg/dL (ref 0–99)
NonHDL: 114.1
Total CHOL/HDL Ratio: 3
Triglycerides: 152 mg/dL — ABNORMAL HIGH (ref 0.0–149.0)
VLDL: 30.4 mg/dL (ref 0.0–40.0)

## 2023-12-10 LAB — VITAMIN B12: Vitamin B-12: 704 pg/mL (ref 211–911)

## 2023-12-10 LAB — IRON: Iron: 74 ug/dL (ref 42–165)

## 2023-12-11 ENCOUNTER — Ambulatory Visit: Payer: Self-pay | Admitting: Family Medicine

## 2023-12-16 NOTE — Progress Notes (Signed)
 Lab results have been discussed.   Verbalized understanding? Yes  Are there any questions? No

## 2023-12-28 ENCOUNTER — Other Ambulatory Visit: Payer: Self-pay | Admitting: Oncology

## 2023-12-28 ENCOUNTER — Other Ambulatory Visit: Payer: Self-pay

## 2023-12-28 DIAGNOSIS — E538 Deficiency of other specified B group vitamins: Secondary | ICD-10-CM

## 2023-12-29 ENCOUNTER — Other Ambulatory Visit (HOSPITAL_COMMUNITY): Payer: Self-pay

## 2024-01-06 ENCOUNTER — Other Ambulatory Visit (HOSPITAL_COMMUNITY): Payer: Self-pay

## 2024-01-07 ENCOUNTER — Encounter: Payer: Self-pay | Admitting: Oncology

## 2024-01-10 ENCOUNTER — Other Ambulatory Visit (HOSPITAL_COMMUNITY): Payer: Self-pay

## 2024-01-10 ENCOUNTER — Other Ambulatory Visit: Payer: Self-pay | Admitting: Oncology

## 2024-01-10 DIAGNOSIS — E538 Deficiency of other specified B group vitamins: Secondary | ICD-10-CM

## 2024-01-10 MED ORDER — VITAMIN B-12 1000 MCG PO TABS
1000.0000 ug | ORAL_TABLET | Freq: Every day | ORAL | 3 refills | Status: AC
  Filled 2024-01-10: qty 90, 90d supply, fill #0

## 2024-02-10 ENCOUNTER — Other Ambulatory Visit (HOSPITAL_COMMUNITY): Payer: Self-pay

## 2024-02-10 MED ORDER — MELOXICAM 15 MG PO TABS
15.0000 mg | ORAL_TABLET | Freq: Every day | ORAL | 0 refills | Status: AC
  Filled 2024-02-10: qty 30, 30d supply, fill #0

## 2024-12-11 ENCOUNTER — Encounter: Admitting: Family Medicine
